# Patient Record
Sex: Female | Born: 1970 | Race: White | Hispanic: No | Marital: Married | Smoking: Never smoker
Health system: Southern US, Community
[De-identification: ages and names within clinical notes are randomized; demographics above are authoritative.]

## PROBLEM LIST (undated history)

## (undated) DIAGNOSIS — N183 Chronic kidney disease, stage 3 unspecified: Secondary | ICD-10-CM

## (undated) DIAGNOSIS — M109 Gout, unspecified: Secondary | ICD-10-CM

## (undated) DIAGNOSIS — Q6102 Congenital multiple renal cysts: Secondary | ICD-10-CM

## (undated) HISTORY — DX: Gout, unspecified: M10.9

## (undated) HISTORY — DX: Congenital multiple renal cysts: Q61.02

## (undated) HISTORY — DX: Chronic kidney disease, stage 3 unspecified: N18.30

## (undated) HISTORY — DX: Chronic kidney disease, stage 3 (moderate): N18.3

---

## 2014-01-23 DIAGNOSIS — F319 Bipolar disorder, unspecified: Secondary | ICD-10-CM | POA: Insufficient documentation

## 2014-01-31 DIAGNOSIS — F3160 Bipolar disorder, current episode mixed, unspecified: Secondary | ICD-10-CM | POA: Insufficient documentation

## 2015-11-06 DIAGNOSIS — E559 Vitamin D deficiency, unspecified: Secondary | ICD-10-CM | POA: Insufficient documentation

## 2015-11-06 DIAGNOSIS — F3170 Bipolar disorder, currently in remission, most recent episode unspecified: Secondary | ICD-10-CM | POA: Insufficient documentation

## 2017-03-10 DIAGNOSIS — F5105 Insomnia due to other mental disorder: Secondary | ICD-10-CM | POA: Insufficient documentation

## 2017-05-22 DIAGNOSIS — H5213 Myopia, bilateral: Secondary | ICD-10-CM | POA: Insufficient documentation

## 2017-05-22 DIAGNOSIS — H43393 Other vitreous opacities, bilateral: Secondary | ICD-10-CM | POA: Insufficient documentation

## 2018-07-27 ENCOUNTER — Ambulatory Visit: Payer: 59 | Admitting: Psychiatry

## 2018-07-27 ENCOUNTER — Encounter: Payer: Self-pay | Admitting: Psychiatry

## 2018-07-27 DIAGNOSIS — F3111 Bipolar disorder, current episode manic without psychotic features, mild: Secondary | ICD-10-CM | POA: Diagnosis not present

## 2018-07-27 NOTE — Progress Notes (Signed)
      [facility] Counselor/Therapist Progress Note   Patient ID: Isabel Benson, MRN: [id number]  Date: 07/27/2018  Timespent: 47 minutes   Treatment Type: Individual   Reported Symptoms: situational anxiety   Mental Status Exam:    Appearance:   Well Groomed     Behavior:  Appropriate  Motor:  Normal  Speech/Language:   Normal Rate  Affect:  Appropriate  Mood:  normal  Thought process:  normal  Thought content:    WNL  Sensory/Perceptual disturbances:    WNL  Orientation:  oriented to person, place and time/date  Attention:  Good  Concentration:  Good  Memory:  WNL  Fund of knowledge:   Good  Insight:    Good  Judgment:   Good  Impulse Control:  Good     Risk Assessment: Danger to Self:  No Self-injurious Behavior: No Danger to Others: No Duty to Warn:no Physical Aggression / Violence:No  Access to Firearms a concern: No  Gang Involvement:No    Subjective: Patient was present for session.  Patient reported that she continues to do very well.  She shared the positive things that are occurring at her job currently and how things are going to progress.  Patient has made it through traveling without having any manic episode which has been an issue in her previous job.  She also explained she is starting to look towards more of future possibilities in her new work situation.  Discussed what was different about this work situation that she felt is helping her maintain her mood when she travels.  Patient was able to recognize that there is more of a work life balance in her new company which is helping her to be more proactive about self-care.  Patient reported she is also managing the stressors with her husband's family more appropriately able to keep better boundaries.  Ways for patient to continue her progress were discussed in session.  Patient will continue coming once every 2-3 months.  She will call if an appointment as needed sooner   Interventions:  Solution-Oriented/Positive Psychology   Diagnosis:   ICD-10-CM   1. Mild bipolar I disorder, most recent episode manic (HCC) F31.11      Plan: 1.  Patient to continue to engage in individual counseling 5-6 times a year or as needed. 2.  Patient to identify and apply CBT, coping skills learned in treatment to decrease manic and anxiety symptoms. 3.  Patient to contact this office, go to the local ED or call 911 if a crisis or emergency develops between visits.   Stevphen Meuse, [place]

## 2018-08-11 ENCOUNTER — Telehealth: Payer: Self-pay | Admitting: Psychiatry

## 2018-08-11 NOTE — Telephone Encounter (Signed)
Received note from nephrology and labs from 02/16/18. Labs obtained at time of visit on 08/06/18 and results are pending.  Lithium level, BMP, and TSH ordered at last visit on 05/11/18. No results received to date.   Will request that nurse call pt to ask if labs were drawn, and if not, where she would like lab order sent.

## 2018-08-12 NOTE — Telephone Encounter (Signed)
TC UTR LM to call back. 

## 2018-08-17 NOTE — Telephone Encounter (Signed)
TCUTRLM

## 2018-08-23 NOTE — Telephone Encounter (Signed)
Spoke to Pt. She has Scripts for [facility] and will have Labs drawn this week, and will notify us.

## 2018-08-25 ENCOUNTER — Other Ambulatory Visit: Payer: Self-pay | Admitting: Psychiatry

## 2018-08-26 LAB — BASIC METABOLIC PANEL WITH GFR
BUN/Creatinine Ratio: 14 (calc) (ref 6–22)
BUN: 22 mg/dL (ref 7–25)
CO2: 27 mmol/L (ref 20–32)
Calcium: 9.6 mg/dL (ref 8.6–10.2)
Chloride: 105 mmol/L (ref 98–110)
Creat: 1.58 mg/dL — ABNORMAL HIGH (ref 0.50–1.10)
GFR, Est African American: 45 mL/min/{1.73_m2} — ABNORMAL LOW (ref >=60)
GFR, Est Non African American: 39 mL/min/{1.73_m2} — ABNORMAL LOW (ref >=60)
Glucose, Bld: 85 mg/dL (ref 65–99)
Potassium: 4.4 mmol/L (ref 3.5–5.3)
SODIUM: 137 mmol/L (ref 135–146)

## 2018-08-26 LAB — LITHIUM LEVEL: Lithium Lvl: 0.9 mmol/L (ref 0.6–1.2)

## 2018-08-26 LAB — TSH: TSH: 2.33 mIU/L

## 2018-10-04 ENCOUNTER — Encounter: Payer: Self-pay | Admitting: Psychiatry

## 2018-10-04 ENCOUNTER — Ambulatory Visit: Payer: 59 | Admitting: Psychiatry

## 2018-10-04 DIAGNOSIS — F3111 Bipolar disorder, current episode manic without psychotic features, mild: Secondary | ICD-10-CM | POA: Diagnosis not present

## 2018-10-04 NOTE — Progress Notes (Signed)
      [facility] Counselor/Therapist Progress Note  Patient ID: Isabel Benson, MRN: [id number],    Date: 10/04/2018  Time Spent: 45 minutes   Treatment Type: Individual Therapy  Reported Symptoms: at times sleep issues, as well as some mood changes  Mental Status Exam:  Appearance:   Well Groomed     Behavior:  Appropriate  Motor:  Normal  Speech/Language:   Normal Rate  Affect:  Appropriate  Mood:  normal  Thought process:  normal  Thought content:    WNL  Sensory/Perceptual disturbances:    WNL  Orientation:  oriented to person, place and time/date  Attention:  Good  Concentration:  Good  Memory:  WNL  Fund of knowledge:   Good  Insight:    Good  Judgment:   Good  Impulse Control:  Good   Risk Assessment: Danger to Self:  No Self-injurious Behavior: No Danger to Others: No Duty to Warn:no Physical Aggression / Violence:No  Access to Firearms a concern: No  Gang Involvement:No   Subjective: Patient was present for session.  Patient reported that she is continuing to do well overall.  She shared that there were some mood anxious over the holidays but nothing extreme or that she is concerned about.  Patient reported she did well over the holidays despite some of the different personalities she had to interact.  Patient explained that her stepfather's Alzheimer's is getting worse and she is trying to help her mother realize she has to make some changes in her life.  Patient reported she is utilizing her coping skills to make sure she does not take on something that is not hers.  Patient was encouraged to continue using her coping skills to help her perspective in the difficult situation.  Patient shared she is considering a new role at work and feels excited where she is, over the last year. Ways for patient to continue her progress were discussed in session and plans developed.  Interventions: Solution-Oriented/Positive Psychology  Diagnosis:   ICD-10-CM   1. Bipolar I  disorder, most recent episode (or current) manic, mild (HCC) F31.11     Plan: 1.  Patient to continue to engage in individual counseling 2-4 times a month or as needed. 2.  Patient to identify and apply CBT, coping skills learned in treatment to mantain mood. 3.  Patient to contact this office, go to the local ED or call 911 if a crisis or emergency develops between visits.  Stevphen Meuse, [place]

## 2018-11-02 ENCOUNTER — Ambulatory Visit (INDEPENDENT_AMBULATORY_CARE_PROVIDER_SITE_OTHER): Payer: 59 | Admitting: Psychiatry

## 2018-11-02 ENCOUNTER — Encounter: Payer: Self-pay | Admitting: Psychiatry

## 2018-11-02 VITALS — BP 101/63 | HR 69

## 2018-11-02 DIAGNOSIS — Z79899 Other long term (current) drug therapy: Secondary | ICD-10-CM

## 2018-11-02 DIAGNOSIS — F99 Mental disorder, not otherwise specified: Secondary | ICD-10-CM | POA: Diagnosis not present

## 2018-11-02 DIAGNOSIS — F5105 Insomnia due to other mental disorder: Secondary | ICD-10-CM | POA: Diagnosis not present

## 2018-11-02 DIAGNOSIS — F3176 Bipolar disorder, in full remission, most recent episode depressed: Secondary | ICD-10-CM | POA: Diagnosis not present

## 2018-11-02 MED ORDER — OMEGA-3-ACID ETHYL ESTERS 1 G PO CAPS: 1.0000 g | ORAL_CAPSULE | Freq: Every day | ORAL | 11 refills | Status: DC

## 2018-11-02 MED ORDER — L-METHYLFOLATE 15 MG PO TABS: 15.0000 mg | ORAL_TABLET | Freq: Every day | ORAL | 11 refills | Status: DC

## 2018-11-02 MED ORDER — ZOLPIDEM TARTRATE 10 MG PO TABS: 5.0000 mg | ORAL_TABLET | Freq: Every evening | ORAL | 0 refills | Status: DC | PRN

## 2018-11-02 MED ORDER — LORAZEPAM 0.5 MG PO TABS: 0.5000 mg | ORAL_TABLET | Freq: Every evening | ORAL | 0 refills | Status: DC | PRN

## 2018-11-02 MED ORDER — LITHIUM CARBONATE ER 300 MG PO TBCR: 600.0000 mg | EXTENDED_RELEASE_TABLET | Freq: Every day | ORAL | 1 refills | Status: DC

## 2018-11-02 NOTE — Progress Notes (Signed)
Isabel NorrisMeredith Benson [id number] 04/12/1971 48 y.o.  Subjective:   Patient ID:  Isabel Benson is a 48 y.o. (DOB 04/12/1971) female.  Chief Complaint:  Chief Complaint  Patient presents with  . Follow-up    h/o Bipolar D/O and anxiety    HPI Isabel Benson presents to the office today for follow-up of h/o mood lability. She reports "things have been going really good." She reports that "I think my mood has been pretty good overall." She reports some irritability and feeling on edge for about 1-2 days around menses. Denies irritability aside from time of menses. Denies any recent depression. Denies any recent manic s/s to include excessive spending or risky behavior. She reports that energy has been adequate and that she noticed she was tired and needed to rest on the weekend. Motivation has been good. She reports that her appetite is stable. Reports some recent intentional weight loss with eliminating sugar, gluten, etc. She reports that her concentration is "really good" and able to sustaining focus. 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 Able to sit and take practice exams for about 2.5-3 hours uninterrupted.   She reports adequate sleep and estimates sleeping 8 hours on average. "My stress is low" and denies any recent anxiety. Reports that she is enjoying her job that she started last May. Reports that she has not needed Ativan prn or Ambien prn recently. She reports that she has been traveling periodically with her work and has not had any disruption in mood s/s with travel.   Denies SI.    Review of Systems:  Review of Systems  Musculoskeletal: Negative for gait problem.  Neurological: Negative for tremors.  Psychiatric/Behavioral:       Please refer to HPI    Medications: I have reviewed the patient's current medications.  Current Outpatient Medications  Medication Sig Dispense Refill  . L-Methylfolate 15 MG TABS Take 1 tablet (15 mg total) by mouth daily  for 30 days. 30 tablet 11  . lithium carbonate (LITHOBID) 300 MG CR tablet Take 2 tablets (600 mg total) by mouth at bedtime. 180 tablet 1  . omega-3 acid ethyl esters (LOVAZA) 1 g capsule Take 1 capsule (1 g total) by mouth daily for 30 days. 30 capsule 11  . LORazepam (ATIVAN) 0.5 MG tablet Take 1 tablet (0.5 mg total) by mouth at bedtime as needed for up to 30 days for anxiety. 30 tablet 0  . zolpidem (AMBIEN) 10 MG tablet Take 0.5 tablets (5 mg total) by mouth at bedtime as needed for up to 30 days for sleep. 30 tablet 0   No current facility-administered medications for this visit.     Medication Side Effects: Other: Occasional  "twitching" of hands and maybe occasionally in her feet.   Allergies: No Known Allergies  Past Medical History:  Diagnosis Date  . Chronic renal failure, stage 3 (moderate) (HCC)   . Multiple renal cysts     Family History  Problem Relation Age of Onset  . Mood Disorder Father   . Anxiety disorder Sister   . Depression Sister   . Depression Maternal Grandmother     Social History   Socioeconomic History  . Marital status: Married    Spouse name: Not on file  . Number of children: Not on file  . Years of education: Not on file  . Highest education level: Not on file  Occupational History  . Not on file  Social Needs  . Financial resource strain: Not  on file  . Food insecurity:    Worry: Not on file    Inability: Not on file  . Transportation needs:    Medical: Not on file    Non-medical: Not on file  Tobacco Use  . Smoking status: Never Smoker  . Smokeless tobacco: Never Used  Substance and Sexual Activity  . Alcohol use: Not Currently    Frequency: Never  . Drug use: Never  . Sexual activity: Not on file  Lifestyle  . Physical activity:    Days per week: Not on file    Minutes per session: Not on file  . Stress: Not on file  Relationships  . Social connections:    Talks on phone: Not on file    Gets together: Not on file     Attends religious service: Not on file    Active member of club or organization: Not on file    Attends meetings of clubs or organizations: Not on file    Relationship status: Not on file  . Intimate partner violence:    Fear of current or ex partner: Not on file    Emotionally abused: Not on file    Physically abused: Not on file    Forced sexual activity: Not on file  Other Topics Concern  . Not on file  Social History Narrative  . Not on file    Past Medical History, Surgical history, Social history, and Family history were reviewed and updated as appropriate.   Please see review of systems for further details on the patient's review from today.   Objective:   Physical Exam:  BP 101/63   Pulse 69   Physical Exam Constitutional:      General: She is not in acute distress.    Appearance: She is well-developed.  Musculoskeletal:        General: No deformity.  Neurological:     Mental Status: She is alert and oriented to person, place, and time.     Coordination: Coordination normal.  Psychiatric:        Attention and Perception: Attention and perception normal. She does not perceive auditory or visual hallucinations.        Mood and Affect: Mood normal. Mood is not anxious or depressed. Affect is not labile, blunt, angry or inappropriate.        Speech: Speech normal.        Behavior: Behavior normal.        Thought Content: Thought content normal. Thought content does not include homicidal or suicidal ideation. Thought content does not include homicidal or suicidal plan.        Cognition and Memory: Cognition and memory normal.        Judgment: Judgment normal.     Comments: Insight intact. No delusions.      Lab Review:     Component Value Date/Time   NA 137 08/25/2018 0833   K 4.4 08/25/2018 0833   CL 105 08/25/2018 0833   CO2 27 08/25/2018 0833   GLUCOSE 85 08/25/2018 0833   BUN 22 08/25/2018 0833   CREATININE 1.58 (H) 08/25/2018 0833   CALCIUM 9.6  08/25/2018 0833   GFRNONAA 39 (L) 08/25/2018 0833   GFRAA 45 (L) 08/25/2018 0833    No results found for: WBC, RBC, HGB, HCT, PLT, MCV, MCH, MCHC, RDW, LYMPHSABS, MONOABS, EOSABS, BASOSABS  Lithium Lvl  Date Value Ref Range Status  08/25/2018 0.9 0.6 - 1.2 mmol/L Final     No results  found for: PHENYTOIN, PHENOBARB, VALPROATE, CBMZ   .res Assessment: Plan:   Discussed continuing current plan of care since mood and anxiety s/s are well controlled and nephrologist notes that renal function remains stable. Discussed continuing to monitor Cr and Lithium level every 6 months to assess for any s/s of worsening renal function/or elevations in Lithium level.  Continue Lithium ER 600 mg po QHS for mood. Continue L-methylfolate and Lovaza for augmentation of mood s/s. Continue Ativan 0.5 mg po qd prn and Ambien 5 mg po QHS prn since these medications have been effective in the past for insomnia and manic s/s. Recommend [facility].   Bipolar disorder, in full remission, most recent episode depressed (HCC) - Plan: lithium carbonate (LITHOBID) 300 MG CR tablet, omega-3 acid ethyl esters (LOVAZA) 1 g capsule, L-Methylfolate 15 MG TABS, LORazepam (ATIVAN) 0.5 MG tablet  High risk medication use - Plan: Lithium level  Insomnia due to other mental disorder - Plan: zolpidem (AMBIEN) 10 MG tablet  Please see After Visit Summary for patient specific instructions.  Future Appointments  Date Time Provider Department Center  11/30/2018 11:00 AM Stevphen MeuseIngram, [place], [facility] None    Orders Placed This Encounter  Procedures  . Lithium level      -------------------------------

## 2018-11-30 ENCOUNTER — Ambulatory Visit (INDEPENDENT_AMBULATORY_CARE_PROVIDER_SITE_OTHER): Payer: 59 | Admitting: Psychiatry

## 2018-11-30 ENCOUNTER — Encounter: Payer: Self-pay | Admitting: Psychiatry

## 2018-11-30 DIAGNOSIS — F3176 Bipolar disorder, in full remission, most recent episode depressed: Secondary | ICD-10-CM | POA: Diagnosis not present

## 2018-11-30 NOTE — Progress Notes (Signed)
      [facility] Counselor/Therapist Progress Note  Patient ID: Isabel Benson, MRN: [id number],    Date: 11/30/2018  Time Spent: 52 minutes  Treatment Type: Individual Therapy  Reported Symptoms: some sadness/ negative thoughts  Mental Status Exam:  Appearance:   Casual     Behavior:  Appropriate  Motor:  Normal  Speech/Language:   Normal Rate  Affect:  Appropriate  Mood:  normal  Thought process:  normal  Thought content:    WNL  Sensory/Perceptual disturbances:    WNL  Orientation:  oriented to person, place, time/date and situation  Attention:  Good  Concentration:  Good  Memory:  WNL  Fund of knowledge:   Good  Insight:    Good  Judgment:   Good  Impulse Control:  Good   Risk Assessment: Danger to Self:  No Self-injurious Behavior: No Danger to Others: No Duty to Warn:no Physical Aggression / Violence:No  Access to Firearms a concern: No  Gang Involvement:No   Subjective: Patient was present for session.  She explained overall she felt like she was doing very well.  She went on to share she has these moments when she starts thinking about the future and has a lot of what its and uncertainty.  She explained that that cycles into very negative thoughts and can surface some depression.  Developed treatment plan with patient to address that issue.  Discussed cognitive distortions and the importance of recognizing them and being able to change the thoughts.  Patient was encouraged to first start writing down the truth/facts about her life and how things have worked out and progressed so well for her over the past few years.  Patient is to work on reading that information regularly to help her maintain perspective.  Patient is also going to work on finding more things to do in the evening that help her feel productive and keep her mind engaged in positives.  Interventions: Cognitive Behavioral Therapy and Solution-Oriented/Positive Psychology  Diagnosis:   ICD-10-CM    1. Bipolar affective disorder, depressed, in full remission (HCC) F31.76     Plan: 1.  Patient to continue to engage in individual counseling 2-4 times a month or as needed. 2.  Patient to identify and apply CBT, coping skills learned in session to decrease depression symptoms. 3.  Patient to contact this office, go to the local ED or call 911 if a crisis or emergency develops between visits.  Stevphen Meuse, [place]  This record has been created using [employer].  Chart creation errors have been sought, but may not always have been located and corrected. Such creation errors do not reflect on the standard of medical care.

## 2019-01-31 ENCOUNTER — Telehealth: Payer: Self-pay | Admitting: Psychiatry

## 2019-01-31 ENCOUNTER — Ambulatory Visit: Payer: 59 | Admitting: Psychiatry

## 2019-01-31 ENCOUNTER — Other Ambulatory Visit: Payer: Self-pay

## 2019-01-31 ENCOUNTER — Encounter: Payer: Self-pay | Admitting: Psychiatry

## 2019-01-31 DIAGNOSIS — F3176 Bipolar disorder, in full remission, most recent episode depressed: Secondary | ICD-10-CM | POA: Diagnosis not present

## 2019-01-31 DIAGNOSIS — Z79899 Other long term (current) drug therapy: Secondary | ICD-10-CM

## 2019-01-31 NOTE — Telephone Encounter (Signed)
Patient called and said that she needs you to resend [facility] orders for [facility]. Do not put future lab order because it will not be received

## 2019-01-31 NOTE — Progress Notes (Signed)
[facility] Counselor/Therapist Progress Note  Patient ID: Isabel Benson, MRN: [id number],    Date: 01/31/2019  Time Spent: 52 minutes start time 11:00 AM end time 11:52 AM Virtual Visit via Telephone Note Connected with patient by a video enabled telemedicine/telehealth application or telephone, with their informed consent, and verified patient privacy and that I am speaking with the correct person using two identifiers. I discussed the limitations, risks, security and privacy concerns of performing psychotherapy and management service by telephone and the availability of in person appointments. I also discussed with the patient that there may be a patient responsible charge related to this service. The patient expressed understanding and agreed to proceed. I discussed the treatment planning with the patient. The patient was provided an opportunity to ask questions and all were answered. The patient agreed with the plan and demonstrated an understanding of the instructions. The patient was advised to call  our office if  symptoms worsen or feel they are in a crisis state and need immediate contact.   Therapist Location: home Patient Location: home    Treatment Type: Individual Therapy  Reported Symptoms: anxiety, frustration  Mental Status Exam:  Appearance:   Casual     Behavior:  Sharing  Motor:  Normal  Speech/Language:   Normal Rate  Affect:  Congruent  Mood:  normal  Thought process:  normal  Thought content:    WNL  Sensory/Perceptual disturbances:    WNL  Orientation:  oriented to person, place, time/date and situation  Attention:  Good  Concentration:  Good  Memory:  WNL  Fund of knowledge:   Good  Insight:    Good  Judgment:   Good  Impulse Control:  Good   Risk Assessment: Danger to Self:  No Self-injurious Behavior: No Danger to Others: No Duty to Warn:no Physical Aggression / Violence:No  Access to Firearms a concern: No  Gang Involvement:No    Subjective: Met with patient via virtual session through [employer].  Patient reported that overall she had been doing fairly well.  She has continued to work and had a class on line that she had to participate in which is kept her very engaged and at home.  Patient went on to explain some of the different experience she is had to encounter at her work over the past several weeks.  She reported she has been able to use coping skills to stay calm and be able to work through the open situations appropriately.  Patient did report that she and her husband had a difficult time yesterday and her anxiety and frustration increased greatly.  Patient discussed what occurred and why she got triggered.  Ways to deal with that differently and coping skills to utilizing including different self talk to use when future situations occur when interacting with his family were discussed with patient and plans were developed.  Patient did share that she needs to get her blood work done to make sure her lithium levels are correct.  She explained she was going to go today but found out that the prescription had expired.  Patient was encouraged to contact office right after session so that they can contact Thayer Headings her provider and get the issue resolved.  Patient was reminded that she does not do well when her lithium levels are off at all so that she could not wait on that issue but needed to contact the office right after the session and she agreed to do so.  We  will continue meeting with patient bimonthly since she continues to do well.  Interventions: Solution-Oriented/Positive Psychology  Diagnosis:   ICD-10-CM   1. Bipolar affective disorder, depressed, in full remission ([place]) F31.76     Plan: 1.  Patient to continue to engage in individual counseling 2-4 times a month or as needed. 2.  Patient to identify and apply CBT, coping skills learned in session to decrease depression and anxiety symptoms. 3.  Patient to  contact this office, go to the local ED or call 911 if a crisis or emergency develops between visits.  Lina Sayre, [facility]   This record has been created using [employer].  Chart creation errors have been sought, but may not always have been located and corrected. Such creation errors do not reflect on the standard of medical care.

## 2019-02-02 NOTE — Telephone Encounter (Signed)
[facility]

## 2019-02-11 LAB — LITHIUM LEVEL: Lithium Lvl: 0.8 mmol/L (ref 0.6–1.2)

## 2019-04-07 ENCOUNTER — Other Ambulatory Visit: Payer: Self-pay

## 2019-04-07 ENCOUNTER — Encounter: Payer: Self-pay | Admitting: Psychiatry

## 2019-04-07 ENCOUNTER — Ambulatory Visit (INDEPENDENT_AMBULATORY_CARE_PROVIDER_SITE_OTHER): Payer: 59 | Admitting: Psychiatry

## 2019-04-07 DIAGNOSIS — F3176 Bipolar disorder, in full remission, most recent episode depressed: Secondary | ICD-10-CM

## 2019-04-07 NOTE — Progress Notes (Signed)
[facility] Counselor/Therapist Progress Note  Patient ID: Isabel Benson, MRN: [id number],    Date: 04/07/2019  Time Spent: 47 minutes  Treatment Type: Individual Therapy  Reported Symptoms: anxiety, periods of sadness  Mental Status Exam:  Appearance:   Well Groomed     Behavior:  Appropriate  Motor:  Normal  Speech/Language:   Normal Rate  Affect:  Appropriate  Mood:  normal  Thought process:  normal  Thought content:    WNL  Sensory/Perceptual disturbances:    WNL  Orientation:  oriented to person, place, time/date and situation  Attention:  Good  Concentration:  Good  Memory:  WNL  Fund of knowledge:   Good  Insight:    Good  Judgment:   Good  Impulse Control:  Good   Risk Assessment: Danger to Self:  No Self-injurious Behavior: No Danger to Others: No Duty to Warn:no Physical Aggression / Violence:No  Access to Firearms a concern: No  Gang Involvement:No   Subjective: Patient was present for session.  She reported that overall she feels things are going very well.  She shared that she feels that the isolation is starting to get to her.  She shared she has periods of sadness and that seems to be related to not being able to interact with people the way she loves to.  Patient stated at this point she is working from home and they are not having church or her small group.  Because of those things she is not having the amount of interaction that she likes or typically has in her life.  Patient was encouraged to think through different ways to keep her serotonin up through exercise also patient was encouraged to find safe ways to try and connect with others and have more of that social interaction that seems to help her stay more balanced.  Different strategies to help her were discussed in session.  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  She shared that with the situation when she took the test it did  not go as well as she had hoped again.  Patient stated she is already having some different options in her head encourage patient to follow through on the options and to remind herself that she is doing great with or without the certification.  Patient was also encouraged to remind herself of all the positives that have occurred in the ways that she is maintaining even though the situation is very difficult.  At this time it was agreed patient would stay on a bimonthly basis for sessions since it seems her symptoms are more situational and she has some plans to manage them appropriately.  Patient will contact clinician's office if something changes and she needs to come in for an earlier appointment.  Interventions: Cognitive Behavioral Therapy and Solution-Oriented/Positive Psychology  Diagnosis:   ICD-10-CM   1. Bipolar disorder, in full remission, most recent episode depressed ([place])  F31.76     Plan: 1.  Patient to continue to engage in individual counseling 1 time every other month or as needed. 2.  Patient to identify and apply CBT, coping skills learned in session to decrease depression and anxiety symptoms. 3.  Patient to contact this office, go to the local ED or call 911 if a crisis or emergency develops between visits.  Lina Sayre, [facility]   This record has been created using [employer].  Chart creation errors have  been sought, but may not always have been located and corrected. Such creation errors do not reflect on the standard of medical care.

## 2019-05-03 ENCOUNTER — Ambulatory Visit (INDEPENDENT_AMBULATORY_CARE_PROVIDER_SITE_OTHER): Payer: 59 | Admitting: Psychiatry

## 2019-05-03 ENCOUNTER — Encounter: Payer: Self-pay | Admitting: Psychiatry

## 2019-05-03 ENCOUNTER — Other Ambulatory Visit: Payer: Self-pay

## 2019-05-03 DIAGNOSIS — F99 Mental disorder, not otherwise specified: Secondary | ICD-10-CM

## 2019-05-03 DIAGNOSIS — F5105 Insomnia due to other mental disorder: Secondary | ICD-10-CM | POA: Diagnosis not present

## 2019-05-03 DIAGNOSIS — F3176 Bipolar disorder, in full remission, most recent episode depressed: Secondary | ICD-10-CM

## 2019-05-03 MED ORDER — LORAZEPAM 1 MG PO TABS: 1.0000 mg | ORAL_TABLET | Freq: Every day | ORAL | 1 refills | Status: DC | PRN

## 2019-05-03 MED ORDER — OMEGA-3-ACID ETHYL ESTERS 1 G PO CAPS: 1.0000 g | ORAL_CAPSULE | Freq: Every day | ORAL | 11 refills | Status: DC

## 2019-05-03 MED ORDER — LITHIUM CARBONATE ER 300 MG PO TBCR: 600.0000 mg | EXTENDED_RELEASE_TABLET | Freq: Every day | ORAL | 1 refills | Status: DC

## 2019-05-03 MED ORDER — ZOLPIDEM TARTRATE 10 MG PO TABS: 5.0000 mg | ORAL_TABLET | Freq: Every evening | ORAL | 2 refills | Status: DC | PRN

## 2019-05-03 MED ORDER — L-METHYLFOLATE 15 MG PO TABS: 15.0000 mg | ORAL_TABLET | Freq: Every day | ORAL | 11 refills | Status: DC

## 2019-05-03 NOTE — Progress Notes (Signed)
Isabel NorrisMeredith Wojnarowski [id number] 1971/02/26 48 y.o.  Subjective:   Patient ID:  Isabel Benson is a 48 y.o. (DOB 1971/02/26) female.  Chief Complaint:  Chief Complaint  Patient presents with  . Follow-up    h/o Bipolar D/O    HPI Isabel NorrisMeredith Hazan presents to the office today for follow-up of Bipolar D/O. She has been working remotely since mid-March. She reports that she has been missing the social interaction of work. Husband works remotely. She reports that she has had adjusted to changes and continues to be productive. She reports that her mood has been stable. She reports that she has had some appropriate, expected sadness in response to some losses associated with the pandemic. She reports that she notices some slight irritability right before menses. She reports that this is the only difference in mood that she has noticed. Denies any significant anxiety. Has had some brief situational stress with certain events, such as being involved with termination of an employee. She reports that her appetite has been good. She reports that she feels that she is eating healthier and eating the same amount and has been gaining weight. She reports that she is walking several miles twice daily. Reports that there is less physical activity with working from home. She reports that her energy and motivation are typically good. Denies impaired concentration. She reports that she has been sleeping well. Reports sleeping at least 8 hours a night. Denies any impulsive or risky behavior. Reports less spending. Denies SI.   Reports that her parents have been very cautious about pandemic and limiting interactions.    Reports that she has not been taking [place] recently.  Review of Systems:  Review of Systems  Gastrointestinal: Negative.   Musculoskeletal: Negative for gait problem.  Neurological: Negative for tremors.  Psychiatric/Behavioral:       Please refer to HPI    Medications: I have reviewed the  patient's current medications.  Current Outpatient Medications  Medication Sig Dispense Refill  . L-Methylfolate 15 MG TABS Take 1 tablet (15 mg total) by mouth daily. 30 tablet 11  . lithium carbonate (LITHOBID) 300 MG CR tablet Take 2 tablets (600 mg total) by mouth at bedtime. 180 tablet 1  . LORazepam (ATIVAN) 1 MG tablet Take 1 tablet (1 mg total) by mouth daily as needed for anxiety. 30 tablet 1  . omega-3 acid ethyl esters (LOVAZA) 1 g capsule Take 1 capsule (1 g total) by mouth daily. 30 capsule 11  . zolpidem (AMBIEN) 10 MG tablet Take 0.5 tablets (5 mg total) by mouth at bedtime as needed for sleep. 30 tablet 2   No current facility-administered medications for this visit.     Medication Side Effects: None  Allergies:  Allergies  Allergen Reactions  . Sudafed [Pseudoephedrine Hcl]     Past Medical History:  Diagnosis Date  . Chronic renal failure, stage 3 (moderate) (HCC)   . Multiple renal cysts     Family History  Problem Relation Age of Onset  . Mood Disorder Father   . Anxiety disorder Sister   . Depression Sister   . Depression Maternal Grandmother     Social History   Socioeconomic History  . Marital status: Married    Spouse name: Not on file  . Number of children: Not on file  . Years of education: Not on file  . Highest education level: Not on file  Occupational History  . Not on file  Social Needs  . Financial resource strain: Not  on file  . Food insecurity    Worry: Not on file    Inability: Not on file  . Transportation needs    Medical: Not on file    Non-medical: Not on file  Tobacco Use  . Smoking status: Never Smoker  . Smokeless tobacco: Never Used  Substance and Sexual Activity  . Alcohol use: Not Currently    Frequency: Never  . Drug use: Never  . Sexual activity: Not on file  Lifestyle  . Physical activity    Days per week: Not on file    Minutes per session: Not on file  . Stress: Not on file  Relationships  . Social  Herbalist on phone: Not on file    Gets together: Not on file    Attends religious service: Not on file    Active member of club or organization: Not on file    Attends meetings of clubs or organizations: Not on file    Relationship status: Not on file  . Intimate partner violence    Fear of current or ex partner: Not on file    Emotionally abused: Not on file    Physically abused: Not on file    Forced sexual activity: Not on file  Other Topics Concern  . Not on file  Social History Narrative  . Not on file    Past Medical History, Surgical history, Social history, and Family history were reviewed and updated as appropriate.   Please see review of systems for further details on the patient's review from today.   Objective:   Physical Exam:  Wt 150 lb (68 kg)   Physical Exam Constitutional:      General: She is not in acute distress.    Appearance: She is well-developed.  Musculoskeletal:        General: No deformity.  Neurological:     Mental Status: She is alert and oriented to person, place, and time.     Coordination: Coordination normal.  Psychiatric:        Attention and Perception: Attention and perception normal. She does not perceive auditory or visual hallucinations.        Mood and Affect: Mood normal. Mood is not anxious or depressed. Affect is not labile, blunt, angry or inappropriate.        Speech: Speech normal.        Behavior: Behavior normal.        Thought Content: Thought content normal. Thought content does not include homicidal or suicidal ideation. Thought content does not include homicidal or suicidal plan.        Cognition and Memory: Cognition and memory normal.        Judgment: Judgment normal.     Comments: Insight intact. No delusions.      Lab Review:     Component Value Date/Time   NA 137 08/25/2018 0833   K 4.4 08/25/2018 0833   CL 105 08/25/2018 0833   CO2 27 08/25/2018 0833   GLUCOSE 85 08/25/2018 0833   BUN 22  08/25/2018 0833   CREATININE 1.58 (H) 08/25/2018 0833   CALCIUM 9.6 08/25/2018 0833   GFRNONAA 39 (L) 08/25/2018 0833   GFRAA 45 (L) 08/25/2018 0833    No results found for: WBC, RBC, HGB, HCT, PLT, MCV, MCH, MCHC, RDW, LYMPHSABS, MONOABS, EOSABS, BASOSABS  Lithium Lvl  Date Value Ref Range Status  02/10/2019 0.8 0.6 - 1.2 mmol/L Final     No results found  for: PHENYTOIN, PHENOBARB, VALPROATE, CBMZ   .res Assessment: Plan:    Will continue current plan of care since mood signs and symptoms remain well controlled without significant tolerability issues.  Labs and documentation from nephrologist reviewed. Patient to follow-up in 6 months or sooner if clinically indicated.  Will order labs at that time. Recommend continuing psychotherapy with Stevphen MeuseHolly Ingram, LPC. Patient advised to contact office with any questions, adverse effects, or acute worsening in signs and symptoms.   Sharyl NimrodMeredith was seen today for follow-up.  Diagnoses and all orders for this visit:  Insomnia due to other mental disorder -     LORazepam (ATIVAN) 1 MG tablet; Take 1 tablet (1 mg total) by mouth daily as needed for anxiety. -     zolpidem (AMBIEN) 10 MG tablet; Take 0.5 tablets (5 mg total) by mouth at bedtime as needed for sleep.  Bipolar disorder, in full remission, most recent episode depressed (HCC) -     lithium carbonate (LITHOBID) 300 MG CR tablet; Take 2 tablets (600 mg total) by mouth at bedtime. -     omega-3 acid ethyl esters (LOVAZA) 1 g capsule; Take 1 capsule (1 g total) by mouth daily. -     L-Methylfolate 15 MG TABS; Take 1 tablet (15 mg total) by mouth daily.     Please see After Visit Summary for patient specific instructions.  Future Appointments  Date Time Provider Department Center  06/08/2019 11:00 AM [facility] None  11/03/2019 11:00 AM Corie Chiquitoarter, Alesa Echevarria, [facility] CP[facility] None    No orders of the defined types were placed in this  encounter.   -------------------------------

## 2019-06-08 ENCOUNTER — Encounter: Payer: Self-pay | Admitting: Psychiatry

## 2019-06-08 ENCOUNTER — Other Ambulatory Visit: Payer: Self-pay

## 2019-06-08 ENCOUNTER — Ambulatory Visit (INDEPENDENT_AMBULATORY_CARE_PROVIDER_SITE_OTHER): Payer: 59 | Admitting: Psychiatry

## 2019-06-08 DIAGNOSIS — F3176 Bipolar disorder, in full remission, most recent episode depressed: Secondary | ICD-10-CM

## 2019-06-08 NOTE — Progress Notes (Signed)
      [facility] Counselor/Therapist Progress Note  Patient ID: Isabel Benson, MRN: [id number],    Date: 06/08/2019  Time Spent: 49 minutes start time 11:11 a.m. end time 12 PM  Treatment Type: Individual Therapy  Reported Symptoms: anxiety  Mental Status Exam:  Appearance:   Well Groomed     Behavior:  Appropriate  Motor:  Normal  Speech/Language:   Normal Rate  Affect:  Appropriate  Mood:  normal  Thought process:  normal  Thought content:    WNL  Sensory/Perceptual disturbances:    WNL  Orientation:  oriented to person, place, time/date and situation  Attention:  Good  Concentration:  Good  Memory:  WNL  Fund of knowledge:   Good  Insight:    Good  Judgment:   Good  Impulse Control:  Good   Risk Assessment: Danger to Self:  No Self-injurious Behavior: No Danger to Others: No Duty to Warn:no Physical Aggression / Violence:No  Access to Firearms a concern: No  Gang Involvement:No   Subjective: Patient was present for session.  Patient reported she is continuing to feel good and is functioning more of the way that she wants to function.  Patient explained that the work situation even though it is had at stressors has been very positive for her overall.  She explained that she and her husband are making it through the the quarantine.  Patient went on to share that she has been able to maintain healthy perspectives with her in-laws which is helping her maintain her anxiety.  She did not pass her test again but has figured out another certification to get that would help her at work and is decided to pursue that instead.  Patient is working on her diet and exercise to continue to maintain her mood as well as working on positive affirmations.  Patient was able to report feeling that things are moving in a very positive direction and she is thankful that she is gotten to the other side of her episode and is functioning again.  Patient was encouraged to recognize the importance  of continuing to utilize the tools that are helping her and to continue her positive self-care to continue progress.  Interventions: Solution-Oriented/Positive Psychology  Diagnosis:   ICD-10-CM   1. Bipolar affective disorder, depressed, in full remission ([place])  F31.76     Plan: Patient is to continue working on her diet and exercise as well is her positive affirmations to continue keeping her depression under control and being able to function the way that she wants to in her life.  Patient will continue attending treatment on every other month schedule due to her progress. Long-term goal: Develop healthy cognitive patterns and beliefs about self in the world that lead to alleviation and help prevent the relapse of depression symptoms Short-term goal: Identify and replace depressive thinking that leads to depressive feelings and actions.  Lina Sayre, [facility]

## 2019-08-08 ENCOUNTER — Ambulatory Visit: Payer: 59 | Admitting: Psychiatry

## 2019-08-14 ENCOUNTER — Other Ambulatory Visit: Payer: Self-pay | Admitting: Psychiatry

## 2019-08-14 DIAGNOSIS — F3176 Bipolar disorder, in full remission, most recent episode depressed: Secondary | ICD-10-CM

## 2019-09-13 ENCOUNTER — Ambulatory Visit: Payer: 59 | Admitting: Psychiatry

## 2019-09-26 ENCOUNTER — Other Ambulatory Visit: Payer: Self-pay

## 2019-09-26 ENCOUNTER — Ambulatory Visit (INDEPENDENT_AMBULATORY_CARE_PROVIDER_SITE_OTHER): Payer: 59 | Admitting: Psychiatry

## 2019-09-26 DIAGNOSIS — F3176 Bipolar disorder, in full remission, most recent episode depressed: Secondary | ICD-10-CM

## 2019-09-26 NOTE — Progress Notes (Signed)
[facility] Counselor/Therapist Progress Note  Patient ID: Isabel Benson, MRN: [id number],    Date: 09/26/2019  Time Spent: 49 minutes start time 11:11 AM end time 12 PM  Treatment Type: Individual Therapy  Reported Symptoms: anxiety  Mental Status Exam:  Appearance:   Well Groomed     Behavior:  Appropriate  Motor:  Normal  Speech/Language:   Normal Rate  Affect:  Appropriate  Mood:  normal  Thought process:  normal  Thought content:    WNL  Sensory/Perceptual disturbances:    WNL  Orientation:  oriented to person, place, time/date and situation  Attention:  Good  Concentration:  Good  Memory:  WNL  Fund of knowledge:   Good  Insight:    Good  Judgment:   Good  Impulse Control:  Good   Risk Assessment: Danger to Self:  No Self-injurious Behavior: No Danger to Others: No Duty to Warn:no Physical Aggression / Violence:No  Access to Firearms a concern: No  Gang Involvement:No   Subjective: Patient was present for session.  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  She explained she will be managing a few people and be ready to deal with the restructure that is coming soon. She also reported that she was able to pass her HR tests. Patient reported that she has done well to working from home she is realizing that it will be an adjustment if they go back to the office but she is feeling it will be okay. Her mother is  In a study for care givers at [facility] and that has been helpful for her. Patient reported some issues with not being able to be with her mother at this time.  Patient stated that her biggest stressor is still her husband's family.  They are continuing to make choices concerning not wearing mask and going out which are very concerning for patient.  She explained that she tries not to see much since her husband gets very stressed about it as well but they have to set pretty firm limits with not having contact  to deal with the consequences with his mother.  Patient was encouraged to feel positive about her decisions knowing that she has to do what is right for her mother and her stepfather regardless of how her in-laws see things.  Also she is making a positive decision for them as well since they are both in high risk categories even though they are not acting that way.  Patient was encouraged to stay focused on the things she can control fix and change and to support her husband but not feel she needs to fix the situation.  Patient was encouraged to recognize her progress and to feel good about the direction that things are going for her.  Patient was encouraged to take her medication as directed since that seems to help her tremendously be able to function in the manner she would like.   Interventions: Cognitive Behavioral Therapy and Solution-Oriented/Positive Psychology  Diagnosis:   ICD-10-CM   1. Bipolar affective disorder, depressed, in full remission ([place])  F31.76     Plan: Patient is to utilize CBT and coping skills to help maintain an appropriate mood and not have any depression.  Patient is to continue working on her diet and exercise to be as healthy and take care of her brain in the best possible manner. Long-term goal: Develop healthy cognitive patterns and  beliefs about self in the world that lead to alleviation and help prevent the relapse of depression symptoms Short-term goal: Identify and replace depressive thinking that leads to depressive feelings and actions  Stevphen Meuse, [facility]

## 2019-11-03 ENCOUNTER — Ambulatory Visit: Payer: 59 | Admitting: Psychiatry

## 2019-11-08 ENCOUNTER — Ambulatory Visit (INDEPENDENT_AMBULATORY_CARE_PROVIDER_SITE_OTHER): Payer: 59 | Admitting: Psychiatry

## 2019-11-08 ENCOUNTER — Other Ambulatory Visit: Payer: Self-pay

## 2019-11-08 ENCOUNTER — Encounter: Payer: Self-pay | Admitting: Psychiatry

## 2019-11-08 VITALS — Wt 135.0 lb

## 2019-11-08 DIAGNOSIS — F99 Mental disorder, not otherwise specified: Secondary | ICD-10-CM | POA: Diagnosis not present

## 2019-11-08 DIAGNOSIS — F5105 Insomnia due to other mental disorder: Secondary | ICD-10-CM | POA: Diagnosis not present

## 2019-11-08 DIAGNOSIS — F3176 Bipolar disorder, in full remission, most recent episode depressed: Secondary | ICD-10-CM | POA: Diagnosis not present

## 2019-11-08 DIAGNOSIS — Z79899 Other long term (current) drug therapy: Secondary | ICD-10-CM

## 2019-11-08 MED ORDER — L-METHYLFOLATE 15 MG PO TABS: 15.0000 mg | ORAL_TABLET | Freq: Every day | ORAL | 11 refills | Status: DC

## 2019-11-08 MED ORDER — OMEGA-3-ACID ETHYL ESTERS 1 G PO CAPS: 1.0000 g | ORAL_CAPSULE | Freq: Every day | ORAL | 11 refills | Status: DC

## 2019-11-08 MED ORDER — LITHIUM CARBONATE ER 300 MG PO TBCR: 600.0000 mg | EXTENDED_RELEASE_TABLET | Freq: Every day | ORAL | 1 refills | Status: DC

## 2019-11-08 NOTE — Progress Notes (Signed)
Isabel Benson [id number] 1970/11/16 49 y.o.  Subjective:   Patient ID:  Isabel Benson is a 49 y.o. (DOB 12/07/1970) female.  Chief Complaint:  Chief Complaint  Patient presents with  . Follow-up    h/o Bipolar D/O    HPI Isabel Benson presents to the office today for follow-up of Bipolar D/O. She reports that she has been exercising regularly. She reports that she has been walking regularly and feels that this is beneficial for her mental and physical health. Reports that this is also a source of socialization since she walks with either husband or neighbor. Denies any anxiety or worry. Denies depressed mood. Denies any manic s/s. Energy has been adequate but not excessive and reports that she fees tired at the end of the day. Sleeping well and averaging about 8 hours a night. Concentration has been adequate. Reports that she tries to structure her time and set a schedule with how much time to set aside for certain tasks. Denies SI.   Has been doing a Fast Metabolism diet where she eats certain foods 2 days in a row. She reports starting this in August and initially lost 8-10 lbs and then another 5 lbs. and is now at her previous baseline.   She reports that in December her work became busier and she has been promoted to a management position. She reports that she is excited about her new role. Continues to work remotely and is not traveling for work. Has a home office at the back of her house and leaves her office at the end of the day.   Father has dementia and reports that parents have been having difficulty with the pandemic. She reports some concern about her parents.   Has not needed Ambien or Lorazepam prn recently.     Review of Systems:  Review of Systems  Genitourinary:       Changes in menses  Musculoskeletal: Negative for gait problem.  Neurological: Negative for tremors.       Reports HA's before menses  Psychiatric/Behavioral:       Please refer to HPI     Medications: I have reviewed the patient's current medications.  Current Outpatient Medications  Medication Sig Dispense Refill  . lithium carbonate (LITHOBID) 300 MG CR tablet Take 2 tablets (600 mg total) by mouth at bedtime. 180 tablet 1  . valACYclovir (VALTREX) 500 MG tablet 1 tab po twice a day at onset of symptoms    . L-Methylfolate 15 MG TABS Take 1 tablet (15 mg total) by mouth daily. 30 tablet 11  . LORazepam (ATIVAN) 1 MG tablet Take 1 tablet (1 mg total) by mouth daily as needed for anxiety. (Patient not taking: Reported on 11/08/2019) 30 tablet 1  . omega-3 acid ethyl esters (LOVAZA) 1 g capsule Take 1 capsule (1 g total) by mouth daily. 30 capsule 11  . zolpidem (AMBIEN) 10 MG tablet Take 0.5 tablets (5 mg total) by mouth at bedtime as needed for sleep. 30 tablet 2   No current facility-administered medications for this visit.    Medication Side Effects: None  Allergies:  Allergies  Allergen Reactions  . Sudafed [Pseudoephedrine Hcl]     Past Medical History:  Diagnosis Date  . Chronic renal failure, stage 3 (moderate)   . Multiple renal cysts     Family History  Problem Relation Age of Onset  . Mood Disorder Father   . Anxiety disorder Sister   . Depression Sister   . Depression Maternal  Grandmother     Social History   Socioeconomic History  . Marital status: Married    Spouse name: Not on file  . Number of children: Not on file  . Years of education: Not on file  . Highest education level: Not on file  Occupational History  . Not on file  Tobacco Use  . Smoking status: Never Smoker  . Smokeless tobacco: Never Used  Substance and Sexual Activity  . Alcohol use: Not Currently  . Drug use: Never  . Sexual activity: Not on file  Other Topics Concern  . Not on file  Social History Narrative  . Not on file   Social Determinants of Health   Financial Resource Strain:   . Difficulty of Paying Living Expenses: Not on file  Food Insecurity:    . Worried About Charity fundraiser in the Last Year: Not on file  . Ran Out of Food in the Last Year: Not on file  Transportation Needs:   . Lack of Transportation (Medical): Not on file  . Lack of Transportation (Non-Medical): Not on file  Physical Activity:   . Days of Exercise per Week: Not on file  . Minutes of Exercise per Session: Not on file  Stress:   . Feeling of Stress : Not on file  Social Connections:   . Frequency of Communication with Friends and Family: Not on file  . Frequency of Social Gatherings with Friends and Family: Not on file  . Attends Religious Services: Not on file  . Active Member of Clubs or Organizations: Not on file  . Attends Archivist Meetings: Not on file  . Marital Status: Not on file  Intimate Partner Violence:   . Fear of Current or Ex-Partner: Not on file  . Emotionally Abused: Not on file  . Physically Abused: Not on file  . Sexually Abused: Not on file    Past Medical History, Surgical history, Social history, and Family history were reviewed and updated as appropriate.   Please see review of systems for further details on the patient's review from today.   Objective:   Physical Exam:  Wt 135 lb (61.2 kg)   Physical Exam Constitutional:      General: She is not in acute distress.    Appearance: She is well-developed.  Musculoskeletal:        General: No deformity.  Neurological:     Mental Status: She is alert and oriented to person, place, and time.     Coordination: Coordination normal.  Psychiatric:        Attention and Perception: Attention and perception normal. She does not perceive auditory or visual hallucinations.        Mood and Affect: Mood normal. Mood is not anxious or depressed. Affect is not labile, blunt, angry or inappropriate.        Speech: Speech normal.        Behavior: Behavior normal.        Thought Content: Thought content normal. Thought content is not paranoid or delusional. Thought content  does not include homicidal or suicidal ideation. Thought content does not include homicidal or suicidal plan.        Cognition and Memory: Cognition and memory normal.        Judgment: Judgment normal.     Comments: Insight intact     Lab Review:     Component Value Date/Time   NA 137 08/25/2018 0833   K 4.4 08/25/2018 5993  CL 105 08/25/2018 0833   CO2 27 08/25/2018 0833   GLUCOSE 85 08/25/2018 0833   BUN 22 08/25/2018 0833   CREATININE 1.58 (H) 08/25/2018 0833   CALCIUM 9.6 08/25/2018 0833   GFRNONAA 39 (L) 08/25/2018 0833   GFRAA 45 (L) 08/25/2018 0833    No results found for: WBC, RBC, HGB, HCT, PLT, MCV, MCH, MCHC, RDW, LYMPHSABS, MONOABS, EOSABS, BASOSABS  Lithium Lvl  Date Value Ref Range Status  02/10/2019 0.8 0.6 - 1.2 mmol/L Final     No results found for: PHENYTOIN, PHENOBARB, VALPROATE, CBMZ   .res Assessment: Plan:   Pt seen for 30 minutes and time spent counseling pt re: her questions about long-term use of Lithium and concerns about making her tx wishes known if she develops dementia like her father and other relatives since she is concerned about worsening s/s if Lithium was d/c'd. Encouraged pt to make her wishes known to her health care POA and that she can also designate a secondary heathcare POA in the event that her husband is not able to make her wishes known. Discussed that she could also write a letter expressing her wishes or including it in her healthcare POA documents.  Will order lithium level. Labs from nephrologist reviewed.  Will continue Lithium CR 600 mg po QHS for mood stabilization. Continue l-methyl folate and Lovaza for augmentation of mood.  Will not refill lorazepam or Ambien at this time since pt reports that she has not needed these medications recently. Continue therapy with Stevphen Meuse, LPC. Pt to f/u in 6 months or sooner if clinically indicated.  Patient advised to contact office with any questions, adverse effects, or acute  worsening in signs and symptoms.  Isabel Benson was seen today for follow-up.  Diagnoses and all orders for this visit:  Bipolar disorder, in full remission, most recent episode depressed (HCC) -     lithium carbonate (LITHOBID) 300 MG CR tablet; Take 2 tablets (600 mg total) by mouth at bedtime. -     L-Methylfolate 15 MG TABS; Take 1 tablet (15 mg total) by mouth daily. -     omega-3 acid ethyl esters (LOVAZA) 1 g capsule; Take 1 capsule (1 g total) by mouth daily.  High risk medication use -     Lithium level -     TSH  Insomnia due to other mental disorder     Please see After Visit Summary for patient specific instructions.  Future Appointments  Date Time Provider Department Center  11/24/2019 11:00 AM Stevphen Meuse, [facility] CP-CP None  05/08/2020  8:30 AM Corie Chiquito, PMHNP CP-CP None    Orders Placed This Encounter  Procedures  . Lithium level  . TSH    -------------------------------

## 2019-11-24 ENCOUNTER — Other Ambulatory Visit: Payer: Self-pay

## 2019-11-24 ENCOUNTER — Ambulatory Visit (INDEPENDENT_AMBULATORY_CARE_PROVIDER_SITE_OTHER): Payer: 59 | Admitting: Psychiatry

## 2019-11-24 DIAGNOSIS — F3176 Bipolar disorder, in full remission, most recent episode depressed: Secondary | ICD-10-CM

## 2019-11-24 NOTE — Progress Notes (Signed)
      [facility] Counselor/Therapist Progress Note  Patient ID: Isabel Benson, MRN: [id number],    Date: 11/27/2019  Time Spent: 50 minutes start time 11:09 AM end time 11:59 AM  Treatment Type: Individual Therapy  Reported Symptoms: anxiety   Mental Status Exam:  Appearance:   Well Groomed     Behavior:  Sharing  Motor:  Normal  Speech/Language:   Normal Rate  Affect:  Appropriate  Mood:  normal  Thought process:  normal  Thought content:    WNL  Sensory/Perceptual disturbances:    WNL  Orientation:  oriented to person, place, time/date and situation  Attention:  Good  Concentration:  Good  Memory:  WNL  Fund of knowledge:   Good  Insight:    Good  Judgment:   Good  Impulse Control:  Good   Risk Assessment: Danger to Self:  No Self-injurious Behavior: No Danger to Others: No Duty to Warn:no Physical Aggression / Violence:No  Access to Firearms a concern: No  Gang Involvement:No   Subjective: Patient was present for session.  She shared she is back in the office 3 days a week and it is going okay.  She has hired a new person and it is going well.   She has started her new job and she has had a good review.  She reported she is very happy with her situation and it is hard to imagine that she originally did not want to and the job that she had been fired from.  Patient was encouraged to recognize all the groups and to see what she is learning about her new situation.  Patient acknowledges that she does better interacting in a healthy environment and that she does not need to be in a stressful situation.  Patient shared that her current work situation is so much better it has impacted her mood and a great manner.  Patient stated she is also working on diet and exercise.  She shared she is concerned about her stepfather who seems to be having a decline of his cognitive functioning and the impact it is going to have on her mother.  Patient was encouraged to continue to be a  source of support and to try and give suggestions and remind herself that she has to allow her mother to work through what she has to work through rather than feeling the need to step in and fix things.  Patient reported that she felt that was very positive and would continue to try to do those suggestions.  Agreed that at this time she would be on a every other month basis since she is doing well overall.  Interventions: Solution-Oriented/Positive Psychology  Diagnosis:   ICD-10-CM   1. Bipolar affective disorder, depressed, in full remission (HCC)  F31.76     Plan: Patient is to utilize coping skills to manage mood appropriately.  Patient is to continue working on diet and exercise to help maintain appropriate mood Long-term goal: Develop the ability to recognize accept and cope with feelings of depression Short-term goal: Increase the frequency of assertive behaviors to express needs desires and expectations  Stevphen Meuse, [facility]

## 2019-12-13 LAB — LITHIUM LEVEL: Lithium Lvl: 0.9 mmol/L (ref 0.6–1.2)

## 2019-12-13 LAB — TSH: TSH: 1.8 mIU/L

## 2019-12-27 ENCOUNTER — Telehealth: Payer: Self-pay

## 2019-12-27 NOTE — Telephone Encounter (Signed)
Prior authorization submitted for OMEGA-3 1G CAPSULE, DENIED on 12/26/2019 due to medication is covered for only hypertriglyceridemia through [employer] [id number]  Submitted through cover my meds

## 2020-02-09 ENCOUNTER — Other Ambulatory Visit: Payer: Self-pay

## 2020-02-09 DIAGNOSIS — F3176 Bipolar disorder, in full remission, most recent episode depressed: Secondary | ICD-10-CM

## 2020-02-09 MED ORDER — LITHIUM CARBONATE ER 300 MG PO TBCR: 600.0000 mg | EXTENDED_RELEASE_TABLET | Freq: Every day | ORAL | 1 refills | Status: DC

## 2020-02-14 ENCOUNTER — Ambulatory Visit: Payer: 59 | Admitting: Psychiatry

## 2020-02-24 ENCOUNTER — Other Ambulatory Visit: Payer: Self-pay

## 2020-02-24 ENCOUNTER — Ambulatory Visit (INDEPENDENT_AMBULATORY_CARE_PROVIDER_SITE_OTHER): Payer: 59 | Admitting: Psychiatry

## 2020-02-24 DIAGNOSIS — F3176 Bipolar disorder, in full remission, most recent episode depressed: Secondary | ICD-10-CM | POA: Diagnosis not present

## 2020-02-24 NOTE — Progress Notes (Signed)
°      [facility] Counselor/Therapist Progress Note  Patient ID: Isabel Benson, MRN: [id number],    Date: 02/24/2020  Time Spent: 50 minutes start time 8:09 AM end time 8:59 AM  Treatment Type: Individual Therapy  Reported Symptoms: anxiety  Mental Status Exam:  Appearance:   Well Groomed     Behavior:  Appropriate  Motor:  Normal  Speech/Language:   Normal Rate  Affect:  Appropriate  Mood:  normal  Thought process:  normal  Thought content:    WNL  Sensory/Perceptual disturbances:    WNL  Orientation:  oriented to person, place, time/date and situation  Attention:  Good  Concentration:  Good  Memory:  WNL  Fund of knowledge:   Good  Insight:    Good  Judgment:   Good  Impulse Control:  Good   Risk Assessment: Danger to Self:  No Self-injurious Behavior: No Danger to Others: No Duty to Warn:no Physical Aggression / Violence:No  Access to Firearms a concern: No  Gang Involvement:No   Subjective: Patient was present for session.  She reported that she is doing well.  She reported she is sleeping, eating, well exercising and feeling great. She shared work is going good and she is enjoying her new position.  She is still getting ready to take the test to get another certification but she is come to the point where if it does not work out she is moving on and feeling positive about things.  Patient stated there continues to be drama within her husband's family but she is managing it more appropriately and being able to set boundaries.  Patient has some concern for her stepfather because he continues to have a decline in his cognitive capacity.  Different strategies to help her manage that and help her mother were discussed and plans were developed in session.  Interventions: Solution-Oriented/Positive Psychology  Diagnosis:   ICD-10-CM   1. Bipolar affective disorder, depressed, in full remission (HCC)  F31.76     Plan: Patient is to use CBT and coping skills to decrease  triggered responses.  Is to continue to work on her self-care through diet and exercise.   Long-term goal: Develop the ability to recognize accept and cope with feelings of depression Short-term goal: Increase the frequency of assertive behaviors to express needs desires and expectations  Stevphen Meuse, [facility]

## 2020-04-26 ENCOUNTER — Other Ambulatory Visit: Payer: Self-pay

## 2020-04-26 ENCOUNTER — Ambulatory Visit (INDEPENDENT_AMBULATORY_CARE_PROVIDER_SITE_OTHER): Payer: 59 | Admitting: Psychiatry

## 2020-04-26 DIAGNOSIS — F3176 Bipolar disorder, in full remission, most recent episode depressed: Secondary | ICD-10-CM

## 2020-04-26 NOTE — Progress Notes (Signed)
[facility] Counselor/Therapist Progress Note  Patient ID: Isabel Benson, MRN: [id number],    Date: 04/26/2020  Time Spent: 50 minutes start time 8:06 AM end time 8:56 AM  Treatment Type: Individual Therapy  Reported Symptoms: anxiety  Mental Status Exam:  Appearance:   Well Groomed     Behavior:  Appropriate  Motor:  Normal  Speech/Language:   Normal Rate  Affect:  Appropriate  Mood:  normal  Thought process:  normal  Thought content:    WNL  Sensory/Perceptual disturbances:    WNL  Orientation:  oriented to person, place, time/date and situation  Attention:  Good  Concentration:  Good  Memory:  WNL  Fund of knowledge:   Good  Insight:    Good  Judgment:   Good  Impulse Control:  Good   Risk Assessment: Danger to Self:  No Self-injurious Behavior: No Danger to Others: No Duty to Warn:no Physical Aggression / Violence:No  Access to Firearms a concern: No  Gang Involvement:No   Subjective: Patient was present for session.  She shared that work is very busy and stressful.  Her boss hired someone to report to her without talking to her about it.  Patient reported it has upset her but she is doing better now.  She went on to share that the person that her boss hired does not have the experience in HR and has already made some major blunders that she is having to address.  Patient explained that it is creating anxiety for her but she is managing it and is using her self talk to remind herself that all she has to do is the best she can to trying them and her boss will have to do with anything that does not go well.  Patient also shared that she is having stress with her husband's family but she is working to set good boundaries and to focus on what she can control fix and change and to let the other things go.  She is doing that with her mother as well she is giving her the information on how to help her stepfather and if she chooses not to use it she recognizes she cannot do  anything about that.  Patient stated she is working hard on her self-care through diet and exercise and feels positive about that is going.  Patient was encouraged to continue the progress she is making and to focus on what she can control fix and change in her self-care especially as she deals with the fact that one of her dogs is dying and that is a very hard time for her and her husband since they are like children to them.  Looked at patient's treatment goals and agreed that it seems they are being met so new treatment plan will be developed at next session.  Patient agreed that she can continue going on every other month basis for appointments at this time.  Interventions: Cognitive Behavioral Therapy and Solution-Oriented/Positive Psychology  Diagnosis:   ICD-10-CM   1. Bipolar affective disorder, depressed, in full remission ([place])  F31.76     Plan: Patient is to continue using CBT and coping skills to keep herself grounded and her mood balanced.  Patient is to work on diet and exercise to continue to take care of mood.  Patient is to take medication as directed.  Treatment planning goals will be reassessed at next session. Long-term goal: Develop the ability to recognize accept and cope with feelings of  depression Short-term goal: Increase the frequency of assertive behaviors to express needs desires and expectations  Lina Sayre, [facility]

## 2020-05-08 ENCOUNTER — Ambulatory Visit: Payer: 59 | Admitting: Psychiatry

## 2020-05-21 ENCOUNTER — Encounter: Payer: Self-pay | Admitting: Psychiatry

## 2020-05-21 ENCOUNTER — Ambulatory Visit (INDEPENDENT_AMBULATORY_CARE_PROVIDER_SITE_OTHER): Payer: 59 | Admitting: Psychiatry

## 2020-05-21 ENCOUNTER — Other Ambulatory Visit: Payer: Self-pay

## 2020-05-21 DIAGNOSIS — Z79899 Other long term (current) drug therapy: Secondary | ICD-10-CM | POA: Diagnosis not present

## 2020-05-21 DIAGNOSIS — F3176 Bipolar disorder, in full remission, most recent episode depressed: Secondary | ICD-10-CM | POA: Diagnosis not present

## 2020-05-21 DIAGNOSIS — F5105 Insomnia due to other mental disorder: Secondary | ICD-10-CM | POA: Diagnosis not present

## 2020-05-21 DIAGNOSIS — F99 Mental disorder, not otherwise specified: Secondary | ICD-10-CM | POA: Diagnosis not present

## 2020-05-21 MED ORDER — LORAZEPAM 1 MG PO TABS: 1.0000 mg | ORAL_TABLET | Freq: Every day | ORAL | 0 refills | Status: DC | PRN

## 2020-05-21 MED ORDER — ZOLPIDEM TARTRATE 10 MG PO TABS: 5.0000 mg | ORAL_TABLET | Freq: Every evening | ORAL | 0 refills | Status: DC | PRN

## 2020-05-21 MED ORDER — LITHIUM CARBONATE ER 300 MG PO TBCR: 600.0000 mg | EXTENDED_RELEASE_TABLET | Freq: Every day | ORAL | 1 refills | Status: DC

## 2020-05-21 MED ORDER — L-METHYLFOLATE 15 MG PO TABS: 15.0000 mg | ORAL_TABLET | Freq: Every day | ORAL | 11 refills | Status: DC

## 2020-05-21 NOTE — Progress Notes (Signed)
Isabel Benson [id number] [birth year] 49 y.o.  Subjective:   Patient ID:  Isabel Benson is a 49 y.o. (DOB 1971/03/21) female.  Chief Complaint:  Chief Complaint  Patient presents with  . Follow-up    h/o Bipolar d/o    HPI [name] presents to the office today for follow-up of mood disturbance. "I feel the best I have felt in a long time." She reports that her mood has been "very stable." Denies depressed, irritable, or manic moods. She reports that she has been sleeping very well. She reports that her sleep score is usually in the upper 70's and sometimes low 80's. Denies anxiety. Appetite has been good. Energy and motivation have been good. Concentration is adequate. Denies SI.   Has been running. She started running intervals with her neighbor. Training for half-marathon.   Just returned from vacation in [place] to [facility]. Her work has been busy and has 3 new people working for her. Enjoying her new position after being promoted in January. Has been in the office mostly since February.  Father has dementia.  Her mother has difficulty managing father's illness.     Review of Systems:  Review of Systems  Musculoskeletal: Negative for gait problem.  Neurological: Negative for tremors.  Psychiatric/Behavioral:       Please refer to HPI    Medications: I have reviewed the patient's current medications.  Current Outpatient Medications  Medication Sig Dispense Refill  . lithium carbonate (LITHOBID) 300 MG CR tablet Take 2 tablets (600 mg total) by mouth at bedtime. 180 tablet 1  . L-Methylfolate 15 MG TABS Take 1 tablet (15 mg total) by mouth daily. 30 tablet 11  . LORazepam (ATIVAN) 1 MG tablet Take 1 tablet (1 mg total) by mouth daily as needed for anxiety. 30 tablet 0  . omega-3 acid ethyl esters (LOVAZA) 1 g capsule Take 1 capsule (1 g total) by mouth daily. 30 capsule 11  . valACYclovir (VALTREX) 500 MG tablet 1 tab po twice a day at onset of symptoms     . zolpidem (AMBIEN) 10 MG tablet Take 0.5 tablets (5 mg total) by mouth at bedtime as needed for sleep. 30 tablet 0   No current facility-administered medications for this visit.    Medication Side Effects: None Rare tremor  Allergies:  Allergies  Allergen Reactions  . Sudafed [Pseudoephedrine Hcl]     Past Medical History:  Diagnosis Date  . Chronic renal failure, stage 3 (moderate)   . Multiple renal cysts     Family History  Problem Relation Age of Onset  . Mood Disorder Father   . Anxiety disorder Sister   . Depression Sister   . Depression Maternal Grandmother     Social History   Socioeconomic History  . Marital status: Married    Spouse name: Not on file  . Number of children: Not on file  . Years of education: Not on file  . Highest education level: Not on file  Occupational History  . Not on file  Tobacco Use  . Smoking status: Never Smoker  . Smokeless tobacco: Never Used  Substance and Sexual Activity  . Alcohol use: Not Currently  . Drug use: Never  . Sexual activity: Not on file  Other Topics Concern  . Not on file  Social History Narrative  . Not on file   Social Determinants of Health   Financial Resource Strain:   . Difficulty of Paying Living Expenses: Not on file  Food  Insecurity:   . Worried About Programme researcher, broadcasting/film/video in the Last Year: Not on file  . Ran Out of Food in the Last Year: Not on file  Transportation Needs:   . Lack of Transportation (Medical): Not on file  . Lack of Transportation (Non-Medical): Not on file  Physical Activity:   . Days of Exercise per Week: Not on file  . Minutes of Exercise per Session: Not on file  Stress:   . Feeling of Stress : Not on file  Social Connections:   . Frequency of Communication with Friends and Family: Not on file  . Frequency of Social Gatherings with Friends and Family: Not on file  . Attends Religious Services: Not on file  . Active Member of Clubs or Organizations: Not on file  .  Attends Banker Meetings: Not on file  . Marital Status: Not on file  Intimate Partner Violence:   . Fear of Current or Ex-Partner: Not on file  . Emotionally Abused: Not on file  . Physically Abused: Not on file  . Sexually Abused: Not on file    Past Medical History, Surgical history, Social history, and Family history were reviewed and updated as appropriate.   Please see review of systems for further details on the patient's review from today.   Objective:   Physical Exam:  There were no vitals taken for this visit.  Physical Exam Constitutional:      General: She is not in acute distress. Musculoskeletal:        General: No deformity.  Neurological:     Mental Status: She is alert and oriented to person, place, and time.     Coordination: Coordination normal.  Psychiatric:        Attention and Perception: Attention and perception normal. She does not perceive auditory or visual hallucinations.        Mood and Affect: Mood normal. Mood is not anxious or depressed. Affect is not labile, blunt, angry or inappropriate.        Speech: Speech normal.        Behavior: Behavior normal.        Thought Content: Thought content normal. Thought content is not paranoid or delusional. Thought content does not include homicidal or suicidal ideation. Thought content does not include homicidal or suicidal plan.        Cognition and Memory: Cognition and memory normal.        Judgment: Judgment normal.     Comments: Insight intact     Lab Review:     Component Value Date/Time   NA 137 08/25/2018 0833   K 4.4 08/25/2018 0833   CL 105 08/25/2018 0833   CO2 27 08/25/2018 0833   GLUCOSE 85 08/25/2018 0833   BUN 22 08/25/2018 0833   CREATININE 1.58 (H) 08/25/2018 0833   CALCIUM 9.6 08/25/2018 0833   GFRNONAA 39 (L) 08/25/2018 0833   GFRAA 45 (L) 08/25/2018 0833    No results found for: WBC, RBC, HGB, HCT, PLT, MCV, MCH, MCHC, RDW, LYMPHSABS, MONOABS, EOSABS,  BASOSABS  Lithium Lvl  Date Value Ref Range Status  12/12/2019 0.9 0.6 - 1.2 mmol/L Final     No results found for: PHENYTOIN, PHENOBARB, VALPROATE, CBMZ   .res Assessment: Plan:   Will continue current plan of care since target signs and symptoms are well controlled without any tolerability issues. Continue Lithium 600 mg po QHS for mood stabilization. Continue Deplin 15 mg po qd for augmentation depression.  Pt not taking Ativan or Ambien. Will re-send script for pt to have available as needed if manic s/s recur.  Pt to f/u in 6 months or sooner if clinically indicated.  Patient advised to contact office with any questions, adverse effects, or acute worsening in signs and symptoms.  Rendy was seen today for follow-up.  Diagnoses and all orders for this visit:  Bipolar disorder, in full remission, most recent episode depressed (HCC) -     lithium carbonate (LITHOBID) 300 MG CR tablet; Take 2 tablets (600 mg total) by mouth at bedtime. -     L-Methylfolate 15 MG TABS; Take 1 tablet (15 mg total) by mouth daily.  High risk medication use -     Lithium level  Insomnia due to other mental disorder -     LORazepam (ATIVAN) 1 MG tablet; Take 1 tablet (1 mg total) by mouth daily as needed for anxiety. -     zolpidem (AMBIEN) 10 MG tablet; Take 0.5 tablets (5 mg total) by mouth at bedtime as needed for sleep.     Please see After Visit Summary for patient specific instructions.  Future Appointments  Date Time Provider Department Center  06/26/2020 11:00 AM Stevphen Meuse, [facility] None  11/12/2020 11:00 AM Corie Chiquito, [facility] None    Orders Placed This Encounter  Procedures  . Lithium level    -------------------------------

## 2020-06-23 LAB — LITHIUM LEVEL: Lithium Lvl: 0.7 mmol/L (ref 0.6–1.2)

## 2020-06-25 NOTE — Progress Notes (Signed)
Pt. Made aware.

## 2020-06-26 ENCOUNTER — Ambulatory Visit: Payer: 59 | Admitting: Psychiatry

## 2020-07-13 ENCOUNTER — Ambulatory Visit (INDEPENDENT_AMBULATORY_CARE_PROVIDER_SITE_OTHER): Payer: 59 | Admitting: Psychiatry

## 2020-07-13 ENCOUNTER — Other Ambulatory Visit: Payer: Self-pay

## 2020-07-13 DIAGNOSIS — F3176 Bipolar disorder, in full remission, most recent episode depressed: Secondary | ICD-10-CM

## 2020-07-13 NOTE — Progress Notes (Signed)
      [facility] Counselor/Therapist Progress Note  Patient ID: Makaylie Dedeaux, MRN: [id number],    Date: 07/13/2020  Time Spent: 50 minutes start time 12:07 PM end time 12:57 PM  Treatment Type: Individual Therapy  Reported Symptoms: sadness  Mental Status Exam:  Appearance:   Well Groomed     Behavior:  Appropriate  Motor:  Normal  Speech/Language:   Normal Rate  Affect:  Appropriate  Mood:  normal  Thought process:  normal  Thought content:    WNL  Sensory/Perceptual disturbances:    WNL  Orientation:  oriented to person, place, time/date and situation  Attention:  Good  Concentration:  Good  Memory:  WNL  Fund of knowledge:   Good  Insight:    Good  Judgment:   Good  Impulse Control:  Good   Risk Assessment: Danger to Self:  No Self-injurious Behavior: No Danger to Others: No Duty to Warn:no Physical Aggression / Violence:No  Access to Firearms a concern: No  Gang Involvement:No   Subjective: Patient was present for session.  She shared that they had to put 1 of their dogs down last week and that was very difficult for her.  She shared that it was handled well but still very hard.Everything else is going well.  She is happy at work.  She shared her husband is trying to encourage her to ask for a raise but she is very content and feels she is doing what she wants to do.  Shared their there are lots of stressors with her in-laws.  She explained that her mother-in-law planned 3 different 80th birthday celebrations for her father-in-law.  It was very overwhelming but they were able to get through it.  She also shared she is realizing that her sister-in-law is very selfish and that we will not change which means she and her husband be primary caretakers for his parents.  Patient explained she is feeling that she can handle that with an emotional disconnect so she is not as worried about the situation as her husband.  Patient shared she is just trying to support her husband  through the very difficult changes in their lives.  Her mother is also showing a decline in health but she is using her box to help her realize what she can do what she can do and did not take on things that she cannot do anything about.  Encouraged patient to continue practicing that tool as well as having a healthy diet and continuing her running.  Patient stated she is training for half marathon when she feels that has helped her mood tremendously.  Agreed to continue keeping patient is to an every other month basis.  Interventions: Solution-Oriented/Positive Psychology  Diagnosis:   ICD-10-CM   1. Bipolar affective disorder, depressed, in full remission (HCC)  F31.76     Plan: Patient is to use CBT and coping skills to decrease depression symptoms.  Patient is to continue using her box theory to keep perspective on her in-laws and her mother.  Patient is to continue training for half marathon and working on healthy diet plan. Long-term goal: Develop the ability to recognize accept and cope with feelings of depression Short-term goal: Increase the frequency of assertive behaviors to express needs desires and expectations  Stevphen Meuse, [facility]

## 2020-09-11 ENCOUNTER — Other Ambulatory Visit: Payer: Self-pay

## 2020-09-11 ENCOUNTER — Ambulatory Visit (INDEPENDENT_AMBULATORY_CARE_PROVIDER_SITE_OTHER): Payer: 59 | Admitting: Psychiatry

## 2020-09-11 DIAGNOSIS — F3176 Bipolar disorder, in full remission, most recent episode depressed: Secondary | ICD-10-CM | POA: Diagnosis not present

## 2020-09-11 NOTE — Progress Notes (Signed)
      [facility] Counselor/Therapist Progress Note  Patient ID: Isabel Benson, MRN: [id number],    Date: 09/11/2020  Time Spent: 52 minutes start time 8:11 AM end time 9:03 AM  Treatment Type: Individual Therapy  Reported Symptoms: anxiety, sadness  Mental Status Exam:  Appearance:   Well Groomed     Behavior:  Appropriate  Motor:  Normal  Speech/Language:   Normal Rate  Affect:  Appropriate  Mood:  normal  Thought process:  normal  Thought content:    WNL  Sensory/Perceptual disturbances:    WNL  Orientation:  oriented to person, place, time/date and situation  Attention:  Good  Concentration:  Good  Memory:  WNL  Fund of knowledge:   Good  Insight:    Good  Judgment:   Good  Impulse Control:  Good   Risk Assessment: Danger to Self:  No Self-injurious Behavior: No Danger to Others: No Duty to Warn:no Physical Aggression / Violence:No  Access to Firearms a concern: No  Gang Involvement:No   Subjective: Patient was present for session. She shared that she is preparing for the Holidays and is excited about her family time. She explained that she is having a new system being started at work and that is overwhelming but she feels good about getting through it. Her dad is still struggling cognitively and she is working to support her mother. She shared she has a new direct report that has made some big mistakes and she is having to figure out how to address it.  Patient did EMDR set on an email he sent, suds level 8, negative cognition "it is my fault" felt anxiety in her chest.  Patient was able to reduce suds level to 1.  She was able to recognize that she did everything she was supposed to do and that he was deflecting his poor choices on to her.  Different ways to manage the situation in the future appropriately were discussed with patient.  Interventions: Solution-Oriented/Positive Psychology and Eye Movement Desensitization and Reprocessing (EMDR)  Diagnosis:    ICD-10-CM   1. Bipolar affective disorder, depressed, in full remission (HCC)  F31.76     Plan: Patient is to use CBT and coping skills to decrease depression symptoms.  Patient is to continue exercising and playing her music to release emotions appropriately.  Patient is to take medication as directed.  Patient is to follow plans from session to deal with her new direct employee. Long-term goal: Develop the ability to recognize accept and cope with feelings of depression Short-term goal: Increase the frequency of assertive behaviors to express needs desires and expectations  Stevphen Meuse, [facility]

## 2020-09-26 DIAGNOSIS — R300 Dysuria: Secondary | ICD-10-CM | POA: Insufficient documentation

## 2020-09-26 DIAGNOSIS — B351 Tinea unguium: Secondary | ICD-10-CM | POA: Insufficient documentation

## 2020-09-26 DIAGNOSIS — R339 Retention of urine, unspecified: Secondary | ICD-10-CM | POA: Insufficient documentation

## 2020-09-26 DIAGNOSIS — E040 Nontoxic diffuse goiter: Secondary | ICD-10-CM | POA: Insufficient documentation

## 2020-09-26 DIAGNOSIS — N183 Chronic kidney disease, stage 3 unspecified: Secondary | ICD-10-CM | POA: Insufficient documentation

## 2020-10-24 ENCOUNTER — Other Ambulatory Visit: Payer: Self-pay

## 2020-10-24 ENCOUNTER — Ambulatory Visit (INDEPENDENT_AMBULATORY_CARE_PROVIDER_SITE_OTHER): Payer: 59 | Admitting: Psychiatry

## 2020-10-24 DIAGNOSIS — F3176 Bipolar disorder, in full remission, most recent episode depressed: Secondary | ICD-10-CM | POA: Diagnosis not present

## 2020-10-24 NOTE — Progress Notes (Signed)
      [facility] Counselor/Therapist Progress Note  Patient ID: Isabel Benson, MRN: [id number],    Date: 10/24/2020  Time Spent: 49 minutes start time 11:03 AM end time 11:52 AM  Treatment Type: Individual Therapy  Reported Symptoms: anxiety, sadness  Mental Status Exam:  Appearance:   Well Groomed     Behavior:  Appropriate  Motor:  Normal  Speech/Language:   Normal Rate  Affect:  Appropriate  Mood:  normal  Thought process:  normal  Thought content:    WNL  Sensory/Perceptual disturbances:    WNL  Orientation:  oriented to person, place, time/date and situation  Attention:  Good  Concentration:  Good  Memory:  WNL  Fund of knowledge:   Good  Insight:    Good  Judgment:   Good  Impulse Control:  Good   Risk Assessment: Danger to Self:  No Self-injurious Behavior: No Danger to Others: No Duty to Warn:no Physical Aggression / Violence:No  Access to Firearms a concern: No  Gang Involvement:No   Subjective: Patient was present for session.  She shared that things are still stressful with her work.  She went on to report she is trying to get things better.  She is feeling more relaxed with the 2 people that are doing their jobs and that is helping.  Patient discussed other things that are currently occurring with her work which are creating her stress.  Discussed different ways to talk her through the situation so that she can feel more empowered and feel better about things.  Patient shared that she is been running a lot to release her stress appropriately and has found that to be helpful she was encouraged to continue engaging in that behavior.  Patient was also encouraged to continue writing down the positive things so she can remind herself of those when she starts feeling overwhelmed.  Patient shared she is doing a lot of writing when it comes to her supervisee that is creating stress and she is just trying to manage it as appropriately as she can.  Patient also shared that  her mother is taking on more than she needs to and she is trying to remind herself that is not in her box and she has to release her mother's choices.  Patient was encouraged to feel positive about all the progress she is continuing to make.  She was encouraged to continue to focus on her self-care as well as continuing adding things into her life that she finds enjoyable.  Interventions: Cognitive Behavioral Therapy and Solution-Oriented/Positive Psychology  Diagnosis:   ICD-10-CM   1. Bipolar disorder, in full remission, most recent episode depressed (HCC)  F31.76     Plan: Patient is to use CBT and coping skills to decrease depression symptoms.  Patient is to continue exercising regularly.  Patient is to continue planning things that are positive to look forward to.  Patient is to remind herself to focus on the things that she can control fix and change. Long-term goal: Develop the ability to recognize accept and cope with feelings of depression Short-term goal: Increase the frequency of assertive behaviors to express needs desires and expectations  Stevphen Meuse, [facility]

## 2020-11-12 ENCOUNTER — Ambulatory Visit: Payer: 59 | Admitting: Psychiatry

## 2020-11-13 ENCOUNTER — Other Ambulatory Visit: Payer: Self-pay

## 2020-11-13 DIAGNOSIS — F3176 Bipolar disorder, in full remission, most recent episode depressed: Secondary | ICD-10-CM

## 2020-11-13 MED ORDER — L-METHYLFOLATE 15 MG PO TABS: 15.0000 mg | ORAL_TABLET | Freq: Every day | ORAL | 11 refills | Status: DC

## 2020-11-26 ENCOUNTER — Other Ambulatory Visit: Payer: Self-pay

## 2020-11-26 ENCOUNTER — Ambulatory Visit (INDEPENDENT_AMBULATORY_CARE_PROVIDER_SITE_OTHER): Payer: 59 | Admitting: Psychiatry

## 2020-11-26 ENCOUNTER — Encounter: Payer: Self-pay | Admitting: Psychiatry

## 2020-11-26 DIAGNOSIS — Z79899 Other long term (current) drug therapy: Secondary | ICD-10-CM | POA: Diagnosis not present

## 2020-11-26 DIAGNOSIS — F3176 Bipolar disorder, in full remission, most recent episode depressed: Secondary | ICD-10-CM

## 2020-11-26 MED ORDER — L-METHYLFOLATE 15 MG PO TABS
15.0000 mg | ORAL_TABLET | Freq: Every day | ORAL | 3 refills | Status: DC
Start: 2020-11-26 — End: 2021-11-21

## 2020-11-26 MED ORDER — LITHIUM CARBONATE ER 300 MG PO TBCR: 600.0000 mg | EXTENDED_RELEASE_TABLET | Freq: Every day | ORAL | 1 refills | Status: DC

## 2020-11-26 MED ORDER — OMEGA-3-ACID ETHYL ESTERS 1 G PO CAPS: 1.0000 g | ORAL_CAPSULE | Freq: Every day | ORAL | 3 refills | Status: DC

## 2020-11-26 NOTE — Progress Notes (Signed)
Isabel Benson [id number] 18-Feb-1971 50 y.o.  Subjective:   Patient ID:  Isabel Benson is a 50 y.o. (DOB Jul 20, 1971) female.  Chief Complaint:  Chief Complaint  Patient presents with  . Follow-up    Bipolar D/O    HPI Isabel Benson presents to the office today for follow-up of Bipolar Disorder. She reports that she has been sleeping well. She reports that her mood has been "fine" with a very slight dip" in mood in January after Christmas. Denies anxiety. Energy and motivation have been good. She reports that her appetite has been good. She is starting a new healthy eating plan this week. Concentration has been adequate. Denies SI.   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  Ran a half marathon in November and also a 5k. She has been enjoying walking and running with friends.   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 She reports that she was "touched" by people's testimonies.   Went to [employer] with a group of old friends.   Has not needed Ativan recently.  Review of Systems:  Review of Systems  Eyes:       Recent LASIK surgery and this went well.  Gastrointestinal: Negative.   Musculoskeletal: Negative for gait problem.  Neurological: Negative for tremors.  Psychiatric/Behavioral:       Please refer to HPI    She reports that she has not had COVID.   Medications: I have reviewed the patient's current medications.  Current Outpatient Medications  Medication Sig Dispense Refill  . glucosamine-chondroitin 500-400 MG tablet Take 1 tablet by mouth 3 (three) times daily.    Marland Kitchen ibuprofen (ADVIL) 200 MG tablet Take 200 mg by mouth every 6 (six) hours as needed.    . valACYclovir (VALTREX) 500 MG tablet 1 tab po twice a day at onset of symptoms    . L-Methylfolate 15 MG TABS Take 1 tablet (15 mg total) by mouth daily. 90 tablet 3  . lithium carbonate (LITHOBID) 300 MG CR tablet Take 2 tablets  (600 mg total) by mouth at bedtime. 180 tablet 1  . LORazepam (ATIVAN) 1 MG tablet Take 1 tablet (1 mg total) by mouth daily as needed for anxiety. 30 tablet 0  . omega-3 acid ethyl esters (LOVAZA) 1 g capsule Take 1 capsule (1 g total) by mouth daily. 90 capsule 3  . zolpidem (AMBIEN) 10 MG tablet Take 0.5 tablets (5 mg total) by mouth at bedtime as needed for sleep. 30 tablet 0   No current facility-administered medications for this visit.    Medication Side Effects: None  Allergies:  Allergies  Allergen Reactions  . Sudafed [Pseudoephedrine Hcl]     Past Medical History:  Diagnosis Date  . Chronic renal failure, stage 3 (moderate) (HCC)   . Multiple renal cysts     Family History  Problem Relation Age of Onset  . Mood Disorder Father   . Anxiety disorder Sister   . Depression Sister   . Depression Maternal Grandmother     Social History   Socioeconomic History  . Marital status: Married    Spouse name: Not on file  . Number of children: Not on file  . Years of education: Not on file  . Highest education level: Not on file  Occupational History  . Not on file  Tobacco Use  . Smoking status: Never Smoker  . Smokeless tobacco: Never Used  Substance and  Sexual Activity  . Alcohol use: Not Currently  . Drug use: Never  . Sexual activity: Not on file  Other Topics Concern  . Not on file  Social History Narrative  . Not on file   Social Determinants of Health   Financial Resource Strain: Not on file  Food Insecurity: Not on file  Transportation Needs: Not on file  Physical Activity: Not on file  Stress: Not on file  Social Connections: Not on file  Intimate Partner Violence: Not on file    Past Medical History, Surgical history, Social history, and Family history were reviewed and updated as appropriate.   Please see review of systems for further details on the patient's review from today.   Objective:   Physical Exam:  There were no vitals taken for  this visit.  Physical Exam Constitutional:      General: She is not in acute distress. Musculoskeletal:        General: No deformity.  Neurological:     Mental Status: She is alert and oriented to person, place, and time.     Coordination: Coordination normal.  Psychiatric:        Attention and Perception: Attention and perception normal. She does not perceive auditory or visual hallucinations.        Mood and Affect: Mood normal. Mood is not anxious or depressed. Affect is not labile, blunt, angry or inappropriate.        Speech: Speech normal.        Behavior: Behavior normal.        Thought Content: Thought content normal. Thought content is not paranoid or delusional. Thought content does not include homicidal or suicidal ideation. Thought content does not include homicidal or suicidal plan.        Cognition and Memory: Cognition and memory normal.        Judgment: Judgment normal.     Comments: Insight intact     Lab Review:     Component Value Date/Time   NA 137 08/25/2018 0833   K 4.4 08/25/2018 0833   CL 105 08/25/2018 0833   CO2 27 08/25/2018 0833   GLUCOSE 85 08/25/2018 0833   BUN 22 08/25/2018 0833   CREATININE 1.58 (H) 08/25/2018 0833   CALCIUM 9.6 08/25/2018 0833   GFRNONAA 39 (L) 08/25/2018 0833   GFRAA 45 (L) 08/25/2018 0833    No results found for: WBC, RBC, HGB, HCT, PLT, MCV, MCH, MCHC, RDW, LYMPHSABS, MONOABS, EOSABS, BASOSABS  Lithium Lvl  Date Value Ref Range Status  06/22/2020 0.7 0.6 - 1.2 mmol/L Final     No results found for: PHENYTOIN, PHENOBARB, VALPROATE, CBMZ   .res Assessment: Plan:   Will continue current plan of care since target signs and symptoms are well controlled without any tolerability issues. Will obtain Lithium level and TSH.  Continue Lithium CR 600 mg po QHS for mood stabilization.  Continue L-Methyl-folate 15 mg po qd for mood s/s. Continue Lovaza 1 gram daily.  Continue Ambien and lorazepam prn anxiety.  Pt to  follow-up in 6 months or sooner if clinically indicated.  Recommend continuing therapy with Stevphen Meuse, [facility].  Patient advised to contact office with any questions, adverse effects, or acute worsening in signs and symptoms.  Isabel Benson was seen today for follow-up.  Diagnoses and all orders for this visit:  Bipolar disorder, in full remission, most recent episode depressed (HCC) -     lithium carbonate (LITHOBID) 300 MG CR tablet; Take 2 tablets (600  mg total) by mouth at bedtime. -     omega-3 acid ethyl esters (LOVAZA) 1 g capsule; Take 1 capsule (1 g total) by mouth daily. -     L-Methylfolate 15 MG TABS; Take 1 tablet (15 mg total) by mouth daily.  High risk medication use -     Lithium level -     TSH     Please see After Visit Summary for patient specific instructions.  Future Appointments  Date Time Provider Department Center  12/24/2020 11:00 AM Stevphen Meuse, [facility] CP-CP None  05/29/2021  8:30 AM Corie Chiquito, [facility] CP-CP None    Orders Placed This Encounter  Procedures  . Lithium level  . TSH    -------------------------------

## 2020-12-07 DIAGNOSIS — N281 Cyst of kidney, acquired: Secondary | ICD-10-CM | POA: Insufficient documentation

## 2020-12-24 ENCOUNTER — Ambulatory Visit (INDEPENDENT_AMBULATORY_CARE_PROVIDER_SITE_OTHER): Payer: BC Managed Care – PPO | Admitting: Psychiatry

## 2020-12-24 ENCOUNTER — Other Ambulatory Visit: Payer: Self-pay

## 2020-12-24 DIAGNOSIS — F3176 Bipolar disorder, in full remission, most recent episode depressed: Secondary | ICD-10-CM

## 2020-12-24 NOTE — Progress Notes (Signed)
      [facility] Counselor/Therapist Progress Note  Patient ID: Isabel Benson, MRN: [id number],    Date: 12/24/2020  Time Spent: 47 minutes start time 11:13 AM end time 12 PM  Treatment Type: Individual Therapy  Reported Symptoms: anxiety  Mental Status Exam:  Appearance:   Well Groomed     Behavior:  Appropriate  Motor:  Normal  Speech/Language:   Normal Rate  Affect:  Appropriate  Mood:  normal  Thought process:  normal  Thought content:    WNL  Sensory/Perceptual disturbances:    WNL  Orientation:  oriented to person, place, time/date and situation  Attention:  Good  Concentration:  Good  Memory:  WNL  Fund of knowledge:   Good  Insight:    Good  Judgment:   Good  Impulse Control:  Good   Risk Assessment: Danger to Self:  No Self-injurious Behavior: No Danger to Others: No Duty to Warn:no Physical Aggression / Violence:No  Access to Firearms a concern: No  Gang Involvement:No   Subjective: Patient was present for session.  She shared that she is doing well overall. She explained that things are still stressful with her due to her employee not doing his job. She shared that she is documenting everything going on with him. She shared that she had to deal with merritt increases and hers was not comparable to what she needed to make.  Patient was able to communicate her disappointment with her boss which was progress for her.  Patient reported that she is realizing if she needs to move forward with her career somewhere else she can do that.  She went on to explain at this point she is going to enjoy her time off and just not put as much into her job as she has and see where it goes from there.  Patient was encouraged to continue to maintain perspective that she decides what she wants to do for her future.  Patient shared other details of her life and each seem to be going well.  She shared she is working on exercising and taking care of herself.  She is also planning trips  that she is looking forward to.  Patient will reschedule for 3 months out since she is continuing to maintain great progress.  Interventions: Solution-Oriented/Positive Psychology  Diagnosis:   ICD-10-CM   1. Bipolar disorder, in full remission, most recent episode depressed (HCC)  F31.76     Plan: Patient is to use CBT and coping skills to decrease anxiety.  And maintain reduced depression symptoms.  Patient will continue exercising and eating healthy.  She will continue maintaining perspective and working on self-care.  Patient will take medication as directed.  Patient will contact office if there is a change in status and she needs an appointment earlier than 3 months out.  Stevphen Meuse, [facility]

## 2021-03-18 ENCOUNTER — Ambulatory Visit: Payer: BC Managed Care – PPO | Admitting: Psychiatry

## 2021-03-18 ENCOUNTER — Other Ambulatory Visit: Payer: Self-pay

## 2021-03-18 DIAGNOSIS — F3176 Bipolar disorder, in full remission, most recent episode depressed: Secondary | ICD-10-CM

## 2021-03-18 NOTE — Progress Notes (Signed)
[facility] Counselor/Therapist Progress Note  Patient ID: Isabel Benson, MRN: [id number],    Date: 03/18/2021  Time Spent: 63 minutes start time 8:07 AM end time 9:10 AM  Treatment Type: Individual Therapy  Reported Symptoms: anxiety, sadness, frustration  Mental Status Exam:  Appearance:   Well Groomed     Behavior:  Appropriate  Motor:  Normal  Speech/Language:   Normal Rate  Affect:  Appropriate  Mood:  normal  Thought process:  normal  Thought content:    WNL  Sensory/Perceptual disturbances:    WNL  Orientation:  oriented to person, place, time/date, and situation  Attention:  Good  Concentration:  Good  Memory:  WNL  Fund of knowledge:   Good  Insight:    Good  Judgment:   Good  Impulse Control:  Good   Risk Assessment: Danger to Self:  No Self-injurious Behavior: No Danger to Others: No Duty to Warn:no Physical Aggression / Violence:No  Access to Firearms a concern: No  Gang Involvement:No   Subjective: Patient was present for session.  She shared that she had gone to the beach with her family and it was very stressful. She explained that taking care of her mother, dad and aunt and uncle were challenging.  Her dad is declining greatly and it was difficult to watch.  She also shared that her mother in law was very upset over them being at the beach for Father's Day.  She went on to share that it was very triggering and overwhelming for her to deal with her mother-in-law's rants.  She shared she has difficulty listening to her talk negatively about her husband especially, but her husband is used to the situation and just wants to not give it any energy or thoughts, so he chooses to ignore it.  Discussed different ways that she could handle it without being disrespectful but being able to set a limit that will help her keep from getting triggered or agitated.  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   Patient explained it creates lots of anxiety and extra stress for her.  Encouraged her to focus on the things that she can do something about and to set certain limits and expectations that he has to meet if he wants to be able to get his increase in salary.  Patient was able to think of some concrete things that she could put into place since there is an upcoming evaluation in September.  Patient also shared that her husband is making some comments about her potentially trying to find another job.  Discussed the pros and cons of that.  Patient was able to acknowledge she may not make as much money as she has in the past but she is having a better work life situation.  Patient reported that they are financially okay so she does not need another job necessarily.  Encouraged patient to recognize that she has seemed much better in this new job.  She is able to do her running connect with friends and is not working in the evenings and being very stressed and overwhelmed.  Reminded patient with her bipolar this sort of situation seems to be working much better for her and there is no reason to try and push things in a manner that are not healthy.  Also patient is currently on the right medication taking things as directed and so long as she continues  to do that she functions very well.  Patient was encouraged to remind her husband of those facts if she needs to because the most important thing is that she is able to stay at a good place and maintain her mental health.  Interventions: Solution-Oriented/Positive Psychology and Insight-Oriented  Diagnosis:   ICD-10-CM   1. Bipolar disorder, in full remission, most recent episode depressed (HCC)  F31.76       Plan: Patient is to use CBT and coping skills to continue decreasing anxiety and depression symptoms.  Patient is to work on limit setting with family members and at the work place.  Patient is to continue focusing on self-care including diet and exercise.   Patient is to take medication as directed.    Stevphen Meuse, [facility]

## 2021-05-01 ENCOUNTER — Other Ambulatory Visit: Payer: Self-pay

## 2021-05-01 ENCOUNTER — Ambulatory Visit (INDEPENDENT_AMBULATORY_CARE_PROVIDER_SITE_OTHER): Payer: BC Managed Care – PPO | Admitting: Psychiatry

## 2021-05-01 ENCOUNTER — Encounter: Payer: Self-pay | Admitting: Psychiatry

## 2021-05-01 VITALS — Wt 136.8 lb

## 2021-05-01 DIAGNOSIS — Z79899 Other long term (current) drug therapy: Secondary | ICD-10-CM | POA: Diagnosis not present

## 2021-05-01 DIAGNOSIS — R635 Abnormal weight gain: Secondary | ICD-10-CM | POA: Diagnosis not present

## 2021-05-01 DIAGNOSIS — F99 Mental disorder, not otherwise specified: Secondary | ICD-10-CM

## 2021-05-01 DIAGNOSIS — R6889 Other general symptoms and signs: Secondary | ICD-10-CM

## 2021-05-01 DIAGNOSIS — F3176 Bipolar disorder, in full remission, most recent episode depressed: Secondary | ICD-10-CM

## 2021-05-01 DIAGNOSIS — F5105 Insomnia due to other mental disorder: Secondary | ICD-10-CM

## 2021-05-01 MED ORDER — LORAZEPAM 1 MG PO TABS: 1.0000 mg | ORAL_TABLET | Freq: Every day | ORAL | 0 refills | Status: DC | PRN

## 2021-05-01 MED ORDER — LITHIUM CARBONATE ER 300 MG PO TBCR: 600.0000 mg | EXTENDED_RELEASE_TABLET | Freq: Every day | ORAL | 1 refills | Status: DC

## 2021-05-01 MED ORDER — ZOLPIDEM TARTRATE 10 MG PO TABS: 5.0000 mg | ORAL_TABLET | Freq: Every evening | ORAL | 0 refills | Status: DC | PRN

## 2021-05-01 NOTE — Progress Notes (Signed)
Isabel Benson [id number] 23-May-1971 50 y.o.  Subjective:   Patient ID:  Isabel Benson is a 50 y.o. (DOB 1971/05/30) female.  Chief Complaint:  Chief Complaint  Patient presents with   Medication Problem    R/O thyroid issue secondary to lithium   Follow-up    Bipolar D/O     HPI Isabel Benson presents to the office today for follow-up of Bipolar D/O and anxiety. She reports that she has not had her period in May, June, and July. Had period last week. She has been increasingly cold. She reports that a PA friend noticed that her throat appeared enlarged and that she was swallowing differently. She reports that her weight has been going up despite running 5 days a week up to 10 mile runs and no significant change in food intake. Started Fast Metabolism diet and lost 5 lbs in a week.   Mood has been stable. Sleeping well. No change in appetite. Energy and motivation have been good. Denies anxiety. Concentration is adequate. Denies SI.   Will seeing obgyn later this month.      Review of Systems:  Review of Systems  Endocrine: Positive for cold intolerance.  Musculoskeletal:  Negative for gait problem.  Skin:        Denies change in hair or skin  Neurological:  Negative for tremors.  Psychiatric/Behavioral:         Please refer to HPI   Medications: I have reviewed the patient's current medications.  Current Outpatient Medications  Medication Sig Dispense Refill   glucosamine-chondroitin 500-400 MG tablet Take 1 tablet by mouth 3 (three) times daily.     L-Methylfolate 15 MG TABS Take 1 tablet (15 mg total) by mouth daily. 90 tablet 3   lithium carbonate (LITHOBID) 300 MG CR tablet Take 2 tablets (600 mg total) by mouth at bedtime. 180 tablet 1   LORazepam (ATIVAN) 1 MG tablet Take 1 tablet (1 mg total) by mouth daily as needed for anxiety. 30 tablet 0   omega-3 acid ethyl esters (LOVAZA) 1 g capsule Take 1 capsule (1 g total) by mouth daily. 90 capsule 3   valACYclovir  (VALTREX) 500 MG tablet 1 tab po twice a day at onset of symptoms     zolpidem (AMBIEN) 10 MG tablet Take 0.5 tablets (5 mg total) by mouth at bedtime as needed for sleep. 30 tablet 0   No current facility-administered medications for this visit.    Medication Side Effects: Other: Possible thyroid issues  Allergies:  Allergies  Allergen Reactions   Sudafed [Pseudoephedrine Hcl]     Past Medical History:  Diagnosis Date   Chronic renal failure, stage 3 (moderate) (HCC)    Multiple renal cysts     Past Medical History, Surgical history, Social history, and Family history were reviewed and updated as appropriate.   Please see review of systems for further details on the patient's review from today.   Objective:   Physical Exam:  Wt 136 lb 12.8 oz (62.1 kg)   Physical Exam Constitutional:      General: She is not in acute distress. Musculoskeletal:        General: No deformity.  Neurological:     Mental Status: She is alert and oriented to person, place, and time.     Coordination: Coordination normal.  Psychiatric:        Attention and Perception: Attention and perception normal. She does not perceive auditory or visual hallucinations.  Mood and Affect: Mood normal. Mood is not anxious or depressed. Affect is not labile, blunt, angry or inappropriate.        Speech: Speech normal.        Behavior: Behavior normal.        Thought Content: Thought content normal. Thought content is not paranoid or delusional. Thought content does not include homicidal or suicidal ideation. Thought content does not include homicidal or suicidal plan.        Cognition and Memory: Cognition and memory normal.        Judgment: Judgment normal.     Comments: Insight intact    Lab Review:     Component Value Date/Time   NA 137 08/25/2018 0833   K 4.4 08/25/2018 0833   CL 105 08/25/2018 0833   CO2 27 08/25/2018 0833   GLUCOSE 85 08/25/2018 0833   BUN 22 08/25/2018 0833   CREATININE  1.58 (H) 08/25/2018 0833   CALCIUM 9.6 08/25/2018 0833   GFRNONAA 39 (L) 08/25/2018 0833   GFRAA 45 (L) 08/25/2018 0833    No results found for: WBC, RBC, HGB, HCT, PLT, MCV, MCH, MCHC, RDW, LYMPHSABS, MONOABS, EOSABS, BASOSABS  Lithium Lvl  Date Value Ref Range Status  06/22/2020 0.7 0.6 - 1.2 mmol/L Final     No results found for: PHENYTOIN, PHENOBARB, VALPROATE, CBMZ   .res Assessment: Plan:    Pt seen for 30 minutes and time spent discussing possible thyroid s/s. Case staffed with Dr Jennelle Human. Will order Thyroid Panel with TSH to rule out possible thyroid abnormality secondary to lithium. She plans to follow-up with medical provider later this month and will order labs so that results will be available at time of visit. Will also re-order Lithium level.  Will continue Lithobid 600 mg po QHS for mood stabilization.  Continue Delpin 15 mg po qd for mood s/s.  Continue Lovaza for mood s/s. Will send scripts for ambien and lorazepam prn to be put on file in case of exacerbation since scripts on file have expired.  Recommend continuing therapy with Stevphen Meuse, [facility].  Pt to follow-up in 6 months or sooner if clinically indicated.  Patient advised to contact office with any questions, adverse effects, or acute worsening in signs and symptoms.   Isabel Benson was seen today for medication problem and follow-up.  Diagnoses and all orders for this visit:  High risk medication use -     Thyroid Panel With TSH -     Lithium level  Cold sensitivity -     Thyroid Panel With TSH  Abnormal weight gain -     Thyroid Panel With TSH  Bipolar disorder, in full remission, most recent episode depressed (HCC) -     lithium carbonate (LITHOBID) 300 MG CR tablet; Take 2 tablets (600 mg total) by mouth at bedtime.  Insomnia due to other mental disorder -     zolpidem (AMBIEN) 10 MG tablet; Take 0.5 tablets (5 mg total) by mouth at bedtime as needed for sleep. -     LORazepam (ATIVAN) 1 MG  tablet; Take 1 tablet (1 mg total) by mouth daily as needed for anxiety.    Please see After Visit Summary for patient specific instructions.  Future Appointments  Date Time Provider Department Center  05/23/2021  8:00 AM Stevphen Meuse, [facility] CP-CP None  11/01/2021  8:30 AM Corie Chiquito, [facility]NP CP-CP None    Orders Placed This Encounter  Procedures   Thyroid Panel With TSH  Lithium level     -------------------------------

## 2021-05-04 LAB — THYROID PANEL WITH TSH
Free Thyroxine Index: 2.7 (ref 1.4–3.8)
T3 Uptake: 33 % (ref 22–35)
T4, Total: 8.3 ug/dL (ref 5.1–11.9)
TSH: 1.44 mIU/L

## 2021-05-04 LAB — LITHIUM LEVEL: Lithium Lvl: 0.9 mmol/L (ref 0.6–1.2)

## 2021-05-06 ENCOUNTER — Encounter: Payer: Self-pay | Admitting: Psychiatry

## 2021-05-22 ENCOUNTER — Ambulatory Visit: Payer: BC Managed Care – PPO | Admitting: Psychiatry

## 2021-05-23 ENCOUNTER — Ambulatory Visit (INDEPENDENT_AMBULATORY_CARE_PROVIDER_SITE_OTHER): Payer: BC Managed Care – PPO | Admitting: Psychiatry

## 2021-05-23 ENCOUNTER — Encounter: Payer: Self-pay | Admitting: Psychiatry

## 2021-05-23 ENCOUNTER — Other Ambulatory Visit: Payer: Self-pay

## 2021-05-23 DIAGNOSIS — F3176 Bipolar disorder, in full remission, most recent episode depressed: Secondary | ICD-10-CM

## 2021-05-23 NOTE — Progress Notes (Signed)
      [facility] Counselor/Therapist Progress Note  Patient ID: Isabel Benson, MRN: [id number],    Date: 05/23/2021  Time Spent: 48 minutes start time 8:12 AM end time 9:00 AM  Treatment Type: Individual Therapy  Reported Symptoms: anxiety, medical issues  Mental Status Exam:  Appearance:   Well Groomed     Behavior:  Appropriate  Motor:  Normal  Speech/Language:   Normal Rate  Affect:  Appropriate  Mood:  normal  Thought process:  normal  Thought content:    WNL  Sensory/Perceptual disturbances:    WNL  Orientation:  oriented to person, place, time/date, and situation  Attention:  Good  Concentration:  Good  Memory:  WNL  Fund of knowledge:   Good  Insight:    Good  Judgment:   Good  Impulse Control:  Good   Risk Assessment: Danger to Self:  No Self-injurious Behavior: No Danger to Others: No Duty to Warn:no Physical Aggression / Violence:No  Access to Firearms a concern: No  Gang Involvement:No   Subjective: Patient was present for session.  She shared that she is having some physical issues and that is creating stress for her.  She shared overall she is doing well.  She explained that things are still stressful with her in laws but she is managing it.  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  Patient was able to share that she feels she is managing things appropriately and working on her self talk to make sure she focuses on the things that she can control fix and change.  Patient was able to acknowledge she does not feel that his behaviors can change but that regardless other people are going to see the truth so she is focusing on what she can do.  Patient was encouraged to continue working on that skill and staying focused on what she can do with the situation.  Patient shared she is also having some medical issues.  Encouraged her to continue working with providers on the different situations that have been concerning for her.  Patient was encouraged to continue  just coming to treatment every 3 months since she seems to be stable at this time.  Interventions: Cognitive Behavioral Therapy and Solution-Oriented/Positive Psychology  Diagnosis:   ICD-10-CM   1. Bipolar disorder, in full remission, most recent episode depressed (HCC)  F31.76       Plan: Patient is to use CBT and coping skills to continue managing emotions appropriately.  Patient is to focus on things that she can control fix and change.  Patient is to take medication as directed.  Patient is to see other appropriate providers for medical issues.   Stevphen Meuse, [facility]

## 2021-05-29 ENCOUNTER — Ambulatory Visit: Payer: Self-pay | Admitting: Psychiatry

## 2021-07-03 ENCOUNTER — Encounter: Payer: Self-pay | Admitting: Family Medicine

## 2021-07-03 ENCOUNTER — Ambulatory Visit: Payer: Self-pay

## 2021-07-03 ENCOUNTER — Ambulatory Visit (INDEPENDENT_AMBULATORY_CARE_PROVIDER_SITE_OTHER): Payer: BC Managed Care – PPO | Admitting: Family Medicine

## 2021-07-03 VITALS — Ht 64.0 in | Wt 140.0 lb

## 2021-07-03 DIAGNOSIS — M25551 Pain in right hip: Secondary | ICD-10-CM

## 2021-07-03 DIAGNOSIS — M84350A Stress fracture, pelvis, initial encounter for fracture: Secondary | ICD-10-CM

## 2021-07-03 NOTE — Patient Instructions (Signed)
Nice to meet you Please try the exercises   Please try heat  I will call with the results.  Please send me a message in MyChart with any questions or updates.  Please see me back in 3-4 weeks.   --Dr. Jordan Likes

## 2021-07-03 NOTE — Progress Notes (Signed)
  Isabel Benson - 50 y.o. female MRN [id number]  Date of birth: [birth year]  SUBJECTIVE:  Including CC & ROS.  No chief complaint on file.   Isabel Benson is a 50 y.o. female that is presenting with right hip pain.  The pain has been ongoing for about a month.  She tries to run and walk for exercise.  No improvement with rest and no history of stress fracture.   Review of Systems See HPI   HISTORY: Past Medical, Surgical, Social, and Family History Reviewed & Updated per EMR.   Pertinent Historical Findings include:  Past Medical History:  Diagnosis Date   Chronic renal failure, stage 3 (moderate) (HCC)    Multiple renal cysts     History reviewed. No pertinent surgical history.  Family History  Problem Relation Age of Onset   Mood Disorder Father    Anxiety disorder Sister    Depression Sister    Depression Maternal Grandmother     Social History   Socioeconomic History   Marital status: Married    Spouse name: Not on file   Number of children: Not on file   Years of education: Not on file   Highest education level: Not on file  Occupational History   Not on file  Tobacco Use   Smoking status: Never   Smokeless tobacco: Never  Substance and Sexual Activity   Alcohol use: Not Currently   Drug use: Never   Sexual activity: Not on file  Other Topics Concern   Not on file  Social History Narrative   Not on file   Social Determinants of Health   Financial Resource Strain: Not on file  Food Insecurity: Not on file  Transportation Needs: Not on file  Physical Activity: Not on file  Stress: Not on file  Social Connections: Not on file  Intimate Partner Violence: Not on file     PHYSICAL EXAM:  VS: Ht 5\' 4"  (1.626 m)   Wt 140 lb (63.5 kg)   LMP 06/06/2021   BMI 24.03 kg/m  Physical Exam Gen: NAD, alert, cooperative with exam, well-appearing   Limited ultrasound: Right hip:  No effusion or changes of the labrum or hip joint. Normal-appearing  ASIS. Increased hyperemia over the lateral pelvic rim when compared to the contralateral side.  Summary: Findings are concerning for stress changes  Ultrasound and interpretation by 06/08/2021, MD     ASSESSMENT & PLAN:   Stress fx pelvis Findings are concerning for stress changes of the pelvis.  Possibly from an unstable hip but no significant effusion appreciated on exam today. -Counseled on home exercise therapy and supportive care. -X-ray. -Could consider injection physical therapy or further imaging.

## 2021-07-04 ENCOUNTER — Other Ambulatory Visit: Payer: Self-pay

## 2021-07-04 ENCOUNTER — Ambulatory Visit (HOSPITAL_BASED_OUTPATIENT_CLINIC_OR_DEPARTMENT_OTHER)
Admission: RE | Admit: 2021-07-04 | Discharge: 2021-07-04 | Disposition: A | Discharge status: Home / Self Care | Payer: BC Managed Care – PPO | Source: Ambulatory Visit | Attending: Family Medicine | Admitting: Family Medicine

## 2021-07-04 DIAGNOSIS — M84350A Stress fracture, pelvis, initial encounter for fracture: Secondary | ICD-10-CM | POA: Diagnosis not present

## 2021-07-04 NOTE — Assessment & Plan Note (Signed)
Findings are concerning for stress changes of the pelvis.  Possibly from an unstable hip but no significant effusion appreciated on exam today. -Counseled on home exercise therapy and supportive care. -X-ray. -Could consider injection physical therapy or further imaging.

## 2021-07-05 ENCOUNTER — Encounter: Payer: Self-pay | Admitting: Family Medicine

## 2021-07-08 ENCOUNTER — Telehealth: Payer: Self-pay | Admitting: Family Medicine

## 2021-07-08 NOTE — Telephone Encounter (Signed)
Informed of results.   Myra Rude, MD [facility] 07/08/2021, 8:59 AM

## 2021-07-26 ENCOUNTER — Ambulatory Visit (INDEPENDENT_AMBULATORY_CARE_PROVIDER_SITE_OTHER): Payer: BC Managed Care – PPO | Admitting: Family Medicine

## 2021-07-26 ENCOUNTER — Encounter: Payer: Self-pay | Admitting: Family Medicine

## 2021-07-26 DIAGNOSIS — M84350D Stress fracture, pelvis, subsequent encounter for fracture with routine healing: Secondary | ICD-10-CM | POA: Diagnosis not present

## 2021-07-26 NOTE — Progress Notes (Signed)
  Isabel Benson - 50 y.o. female MRN [id number]  Date of birth: 1971-04-19  SUBJECTIVE:  Including CC & ROS.  No chief complaint on file.   Isabel Benson is a 50 y.o. female that is following up for her right hip pain.  She has been doing the home exercises and therapy and reports improvement of her pain.  She feels unstable at times but for the most part can walk normally.   Review of Systems See HPI   HISTORY: Past Medical, Surgical, Social, and Family History Reviewed & Updated per EMR.   Pertinent Historical Findings include:  Past Medical History:  Diagnosis Date   Chronic renal failure, stage 3 (moderate) (HCC)    Multiple renal cysts     History reviewed. No pertinent surgical history.  Family History  Problem Relation Age of Onset   Mood Disorder Father    Anxiety disorder Sister    Depression Sister    Depression Maternal Grandmother     Social History   Socioeconomic History   Marital status: Married    Spouse name: Not on file   Number of children: Not on file   Years of education: Not on file   Highest education level: Not on file  Occupational History   Not on file  Tobacco Use   Smoking status: Never   Smokeless tobacco: Never  Substance and Sexual Activity   Alcohol use: Not Currently   Drug use: Never   Sexual activity: Not on file  Other Topics Concern   Not on file  Social History Narrative   Not on file   Social Determinants of Health   Financial Resource Strain: Not on file  Food Insecurity: Not on file  Transportation Needs: Not on file  Physical Activity: Not on file  Stress: Not on file  Social Connections: Not on file  Intimate Partner Violence: Not on file     PHYSICAL EXAM:  VS: BP 104/60 (BP Location: Left Arm, Patient Position: Sitting)   Ht 5\' 4"  (1.626 m)   Wt 140 lb (63.5 kg)   BMI 24.03 kg/m  Physical Exam Gen: NAD, alert, cooperative with exam, well-appearing      ASSESSMENT & PLAN:   Stress fx  pelvis Continues to get improvement.  Is able to walk with no pain today. -Counseled on home exercise therapy and supportive care. -Can start introducing short bouts of running back to her regimen. -Could consider physical therapy or further imaging.

## 2021-07-26 NOTE — Assessment & Plan Note (Signed)
Continues to get improvement.  Is able to walk with no pain today. -Counseled on home exercise therapy and supportive care. -Can start introducing short bouts of running back to her regimen. -Could consider physical therapy or further imaging.

## 2021-08-21 ENCOUNTER — Encounter: Payer: Self-pay | Admitting: Family Medicine

## 2021-08-26 ENCOUNTER — Ambulatory Visit (INDEPENDENT_AMBULATORY_CARE_PROVIDER_SITE_OTHER): Payer: BC Managed Care – PPO | Admitting: Psychiatry

## 2021-08-26 ENCOUNTER — Other Ambulatory Visit: Payer: Self-pay

## 2021-08-26 DIAGNOSIS — F3176 Bipolar disorder, in full remission, most recent episode depressed: Secondary | ICD-10-CM

## 2021-08-26 NOTE — Progress Notes (Signed)
      [facility] Counselor/Therapist Progress Note  Patient ID: Isabel Benson, MRN: [id number],    Date: 08/26/2021  Time Spent: 55 minutes start time 1:07 PM end time 2:02 PM  Treatment Type: Individual Therapy  Reported Symptoms: chronic pain, sadness, anxiety  Mental Status Exam:  Appearance:   Well Groomed     Behavior:  Appropriate  Motor:  Normal  Speech/Language:   Normal Rate  Affect:  Appropriate  Mood:  normal  Thought process:  normal  Thought content:    WNL  Sensory/Perceptual disturbances:    WNL  Orientation:  oriented to person, place, time/date, and situation  Attention:  Good  Concentration:  Good  Memory:  WNL  Fund of knowledge:   Good  Insight:    Good  Judgment:   Good  Impulse Control:  Good   Risk Assessment: Danger to Self:  No Self-injurious Behavior: No Danger to Others: No Duty to Warn:no Physical Aggression / Violence:No  Access to Firearms a concern: No  Gang Involvement:No   Subjective: Patient was present for session.  She shared she is continuing to make progress but having issues physically with her hip. She shared that it has been sad that she has not been able to physically do what she wants to do.  Discussed what that has been like for her and ways that she can manage those emotions appropriately.  Patient also discussed there have been some shifts with her husband and his work which has created anxiety for him and her due to finances.  It has also made her think through if she wants to stay at her current position or seek another option.  Allowed patient time to process what would be best for her at this time.  She was able to develop a plan that she felt would be helpful.  Patient was encouraged to recognize that it is best not to make a quick decision especially since she seems to be pleased at her current position even though it may not pay her as well as other options.  She shared that they have got a new puppy and she feels that  that has helped her mood as well as her husband's.  She was encouraged to continue taking time to enjoy her new puppy and relax and to get back to running and exercise regularly as soon as she is able to.  Interventions: Cognitive Behavioral Therapy and Solution-Oriented/Positive Psychology  Diagnosis:   ICD-10-CM   1. Bipolar disorder, in full remission, most recent episode depressed (HCC)  F31.76       Plan: Patient is to use CBT and coping skills to decrease mood issues.  Patient is to follow plans from session concerning her work situation.  Patient is to continue working with her providers on her physical issues.  Patient is to get back to exercising as soon as she is able.  Patient is to focus on the things that she can do rather than what she cannot.  Patient is to continue taking medication as directed.  Stevphen Meuse, [facility]

## 2021-11-01 ENCOUNTER — Ambulatory Visit: Payer: BC Managed Care – PPO | Admitting: Psychiatry

## 2021-11-01 ENCOUNTER — Other Ambulatory Visit: Payer: Self-pay

## 2021-11-18 ENCOUNTER — Ambulatory Visit: Payer: BC Managed Care – PPO | Admitting: Psychiatry

## 2021-11-21 ENCOUNTER — Ambulatory Visit (INDEPENDENT_AMBULATORY_CARE_PROVIDER_SITE_OTHER): Payer: BC Managed Care – PPO | Admitting: Psychiatry

## 2021-11-21 ENCOUNTER — Encounter: Payer: Self-pay | Admitting: Psychiatry

## 2021-11-21 ENCOUNTER — Other Ambulatory Visit: Payer: Self-pay

## 2021-11-21 DIAGNOSIS — F99 Mental disorder, not otherwise specified: Secondary | ICD-10-CM

## 2021-11-21 DIAGNOSIS — F5105 Insomnia due to other mental disorder: Secondary | ICD-10-CM

## 2021-11-21 DIAGNOSIS — F3176 Bipolar disorder, in full remission, most recent episode depressed: Secondary | ICD-10-CM | POA: Diagnosis not present

## 2021-11-21 MED ORDER — L-METHYLFOLATE 15 MG PO TABS: 15.0000 mg | ORAL_TABLET | Freq: Every day | ORAL | 3 refills | Status: DC

## 2021-11-21 MED ORDER — ZOLPIDEM TARTRATE 10 MG PO TABS: 5.0000 mg | ORAL_TABLET | Freq: Every evening | ORAL | 0 refills | Status: DC | PRN

## 2021-11-21 MED ORDER — LITHIUM CARBONATE ER 300 MG PO TBCR: 600.0000 mg | EXTENDED_RELEASE_TABLET | Freq: Every day | ORAL | 1 refills | Status: DC

## 2021-11-21 MED ORDER — OMEGA-3-ACID ETHYL ESTERS 1 G PO CAPS: 1.0000 g | ORAL_CAPSULE | Freq: Every day | ORAL | 3 refills | Status: DC

## 2021-11-21 MED ORDER — LORAZEPAM 1 MG PO TABS: 1.0000 mg | ORAL_TABLET | Freq: Every day | ORAL | 0 refills | Status: DC | PRN

## 2021-11-21 NOTE — Progress Notes (Signed)
Ellan Tess ?[id number] ?1970/10/10 ?51 y.o. ? ?Subjective:  ? ?Patient ID:  Isabel Benson is a 51 y.o. (DOB 07-19-1971) female. ? ?Chief Complaint:  ?Chief Complaint  ?Patient presents with  ? Follow-up  ?  Bipolar D/O  ? ? ?HPI ?Isabel Benson presents to the office today for follow-up of mood disturbance. She reports, "everything is good." She reports that her mood has been stable overall. Noticed a slight "dip" in mood after the holidays. Denies manic s/s. Denies anxiety. Energy and motivation have been good. Sleeping well and reports that her sleep score has been good. Appetite has been "too good." She reports adequate concentration. Denies SI.  ? ?She has started running again after having hip pain in August. She reports that she did hip exercises twice daily from sports medicine specialist. She got new inserts and this has helped. She reports that she will be running a half marathon soon. She enjoys running with her friends.  ? ?Work has been going well.  ?  ?Has not needed Lorazepam prn or Ambien prn recently. ? ?Review of Systems:  ?Review of Systems  ?Genitourinary:  Positive for urgency. Negative for dysuria.  ?     Reports possible UTI. Has had groin pain and some fatigue.  ?Musculoskeletal:  Negative for gait problem.  ?Neurological:  Negative for tremors.  ?Psychiatric/Behavioral:    ?     Please refer to HPI  ? ?Medications: I have reviewed the patient's current medications. ? ?Current Outpatient Medications  ?Medication Sig Dispense Refill  ? Calcium Carbonate (CALCIUM 500 PO) Take by mouth.    ? cholecalciferol (VITAMIN D3) 25 MCG (1000 UNIT) tablet Take 2,000 Units by mouth daily.    ? glucosamine-chondroitin 500-400 MG tablet Take 1 tablet by mouth 3 (three) times daily.    ? L-Methylfolate 15 MG TABS Take 1 tablet (15 mg total) by mouth daily. 90 tablet 3  ? lithium carbonate (LITHOBID) 300 MG CR tablet Take 2 tablets (600 mg total) by mouth at bedtime. 180 tablet 1  ? LORazepam (ATIVAN) 1 MG  tablet Take 1 tablet (1 mg total) by mouth daily as needed for anxiety. 30 tablet 0  ? omega-3 acid ethyl esters (LOVAZA) 1 g capsule Take 1 capsule (1 g total) by mouth daily. 90 capsule 3  ? valACYclovir (VALTREX) 500 MG tablet 1 tab po twice a day at onset of symptoms    ? zolpidem (AMBIEN) 10 MG tablet Take 0.5 tablets (5 mg total) by mouth at bedtime as needed for sleep. 30 tablet 0  ? ?No current facility-administered medications for this visit.  ? ? ?Medication Side Effects: Other: Occ hand "jerk " ? ?Allergies:  ?Allergies  ?Allergen Reactions  ? Sudafed [Pseudoephedrine Hcl]   ? ? ?Past Medical History:  ?Diagnosis Date  ? Chronic renal failure, stage 3 (moderate) (HCC)   ? Multiple renal cysts   ? ? ?Past Medical History, Surgical history, Social history, and Family history were reviewed and updated as appropriate.  ? ?Please see review of systems for further details on the patient's review from today.  ? ?Objective:  ? ?Physical Exam:  ?Wt 141 lb (64 kg)   BMI 24.20 kg/m?  ? ?Physical Exam ?Constitutional:   ?   General: She is not in acute distress. ?Musculoskeletal:     ?   General: No deformity.  ?Neurological:  ?   Mental Status: She is alert and oriented to person, place, and time.  ?   Coordination: Coordination  normal.  ?Psychiatric:     ?   Attention and Perception: Attention and perception normal. She does not perceive auditory or visual hallucinations.     ?   Mood and Affect: Mood normal. Mood is not anxious or depressed. Affect is not labile, blunt, angry or inappropriate.     ?   Speech: Speech normal.     ?   Behavior: Behavior normal.     ?   Thought Content: Thought content normal. Thought content is not paranoid or delusional. Thought content does not include homicidal or suicidal ideation. Thought content does not include homicidal or suicidal plan.     ?   Cognition and Memory: Cognition and memory normal.     ?   Judgment: Judgment normal.  ?   Comments: Insight intact  ? ? ?Lab  Review:  ?   ?Component Value Date/Time  ? NA 137 08/25/2018 0833  ? K 4.4 08/25/2018 0833  ? CL 105 08/25/2018 0833  ? CO2 27 08/25/2018 0833  ? GLUCOSE 85 08/25/2018 0833  ? BUN 22 08/25/2018 0833  ? CREATININE 1.58 (H) 08/25/2018 4166  ? CALCIUM 9.6 08/25/2018 0833  ? GFRNONAA 39 (L) 08/25/2018 0630  ? GFRAA 45 (L) 08/25/2018 1601  ? ? ?No results found for: WBC, RBC, HGB, HCT, PLT, MCV, MCH, MCHC, RDW, LYMPHSABS, MONOABS, EOSABS, BASOSABS ? ?Lithium Lvl  ?Date Value Ref Range Status  ?05/03/2021 0.9 0.6 - 1.2 mmol/L Final  ?  ? ?No results found for: PHENYTOIN, PHENOBARB, VALPROATE, CBMZ  ? ?.res ?Assessment: Plan:   ? ?Pt seen for 30 minutes and time spent discussing treatment plan. Reviewed creatinine level from last nephrologist visit and she reports that she has upcoming nephrology re-check. Discussed re-checking Lithium level. She reports that she will ask nephrologist to include Lithium level in next scheduled labs and otherwise prefers to wait several months before lithium re-check.  ?Continue Lithium CR 600 mg at bedtime for mood stabilization.  ?Continue L-Methylfolate 15 mg po qd for depression.  ?Continue Lovaza 1 gram daily for mood s/s.  ?Will re-order Ambien prn and Ativan prn for her to have available in case she begins to experience s/s of mania.  ?Pt to follow-up in 6 months or sooner if clinically indicated.  ?Recommend continuing therapy with Stevphen Meuse, [facility].  ?Patient advised to contact office with any questions, adverse effects, or acute worsening in signs and symptoms. ? ? ?Isabel Benson was seen today for follow-up. ? ?Diagnoses and all orders for this visit: ? ?Bipolar disorder, in full remission, most recent episode depressed (HCC) ?-     lithium carbonate (LITHOBID) 300 MG CR tablet; Take 2 tablets (600 mg total) by mouth at bedtime. ?-     L-Methylfolate 15 MG TABS; Take 1 tablet (15 mg total) by mouth daily. ?-     omega-3 acid ethyl esters (LOVAZA) 1 g capsule; Take 1 capsule (1 g  total) by mouth daily. ? ?Insomnia due to other mental disorder ?-     LORazepam (ATIVAN) 1 MG tablet; Take 1 tablet (1 mg total) by mouth daily as needed for anxiety. ?-     zolpidem (AMBIEN) 10 MG tablet; Take 0.5 tablets (5 mg total) by mouth at bedtime as needed for sleep. ? ?  ? ?Please see After Visit Summary for patient specific instructions. ? ?Future Appointments  ?Date Time Provider Department Center  ?01/07/2022 12:00 PM Stevphen Meuse, [facility] None  ? ? ?No orders of the defined  types were placed in this encounter. ? ? ?------------------------------- ?

## 2022-01-07 ENCOUNTER — Ambulatory Visit (INDEPENDENT_AMBULATORY_CARE_PROVIDER_SITE_OTHER): Payer: BC Managed Care – PPO | Admitting: Psychiatry

## 2022-01-07 DIAGNOSIS — F3176 Bipolar disorder, in full remission, most recent episode depressed: Secondary | ICD-10-CM | POA: Diagnosis not present

## 2022-01-07 NOTE — Progress Notes (Signed)
?      [facility] Counselor/Therapist Progress Note ? ?Patient ID: Isabel Benson, MRN: [id number],   ? ?Date: 01/07/2022 ? ?Time Spent: 50 minutes start time 12:08 PM end time 12:58 PM ? ?Treatment Type: Individual Therapy ? ?Reported Symptoms: anxiety ? ?Mental Status Exam: ? ?Appearance:   Well Groomed     ?Behavior:  Appropriate  ?Motor:  Normal  ?Speech/Language:   Normal Rate  ?Affect:  Appropriate  ?Mood:  normal  ?Thought process:  normal  ?Thought content:    WNL  ?Sensory/Perceptual disturbances:    WNL  ?Orientation:  oriented to person, place, time/date, and situation  ?Attention:  Good  ?Concentration:  Good  ?Memory:  WNL  ?Fund of knowledge:   Good  ?Insight:    Good  ?Judgment:   Good  ?Impulse Control:  Good  ? ?Risk Assessment: ?Danger to Self:  No ?Self-injurious Behavior: No ?Danger to Others: No ?Duty to Warn:no ?Physical Aggression / Violence:No  ?Access to Firearms a concern: No  ?Gang Involvement:No  ? ?Subjective: Patient was present for session. She shared she is doing well overall.  She is exercising again and feeling good about that.  Patient reported that she is struggling with some things going on with her father who is having declined mentally.  Discussed different ways that she can support her mother and take care of herself through the transition.  Patient was able to report reconnecting with a friend that lives close to her in-laws [facility] house and shared that has been very positive and it gives her something to do when she needs to as gate from some of the family drama that occurs.  Patient was encouraged to feel good about that connection and to make sure she uses it as needed.  Patient went on to share that she is feeling fairly good with work overall.  She is thinking through some options because of different situations but feels the best thing for her at this point is possibly to stay where she is.  Discussed the fact that the stress is lower there and since stress with work and  the past has not negatively impacted her if she can stay where she is that might be the best option for her.  Patient acknowledged that she does not want to get in an overwhelming situation that could create any disruption in her mental health.  Patient was encouraged to continue doing the things that are working for her and focusing on her self-care. ? ?Interventions: Solution-Oriented/Positive Psychology ? ?Diagnosis: ?  ICD-10-CM   ?1. Bipolar disorder, in full remission, most recent episode depressed (HCC)  F31.76   ?  ? ? ?Plan: Patient is to use CBT and coping skills to decrease mood issues.  Patient is to follow plans from her family situation.  Patient is to continue working with her providers on her physical issues.  Patient is to continue exercising to release negative emotions appropriately.  Patient is to focus on the things that she can do rather than what she cannot.  Patient is to continue taking medication as directed. ? ?Stevphen Meuse, [facility] ? ? ? ? ? ? ? ? ? ? ? ? ? ? ? ? ? ? ?

## 2022-02-26 ENCOUNTER — Other Ambulatory Visit: Payer: Self-pay

## 2022-02-26 DIAGNOSIS — F3176 Bipolar disorder, in full remission, most recent episode depressed: Secondary | ICD-10-CM

## 2022-02-26 MED ORDER — OMEGA-3-ACID ETHYL ESTERS 1 G PO CAPS: 1.0000 g | ORAL_CAPSULE | Freq: Every day | ORAL | 3 refills | Status: DC

## 2022-03-17 ENCOUNTER — Telehealth: Payer: Self-pay

## 2022-04-15 ENCOUNTER — Ambulatory Visit (INDEPENDENT_AMBULATORY_CARE_PROVIDER_SITE_OTHER): Payer: BC Managed Care – PPO | Admitting: Psychiatry

## 2022-04-15 DIAGNOSIS — F3176 Bipolar disorder, in full remission, most recent episode depressed: Secondary | ICD-10-CM | POA: Diagnosis not present

## 2022-04-15 NOTE — Progress Notes (Signed)
      [facility] Counselor/Therapist Progress Note  Patient ID: Isabel Benson, MRN: [id number],    Date: 04/15/2022  Time Spent: 50 minutes start time 12:07 PM end time 12:57 PM  Treatment Type: Individual Therapy  Reported Symptoms: anxiety, sadness  Mental Status Exam:  Appearance:   Well Groomed     Behavior:  Appropriate  Motor:  Normal  Speech/Language:   Normal Rate  Affect:  Appropriate  Mood:  normal  Thought process:  normal  Thought content:    WNL  Sensory/Perceptual disturbances:    WNL  Orientation:  oriented to person, place, time/date, and situation  Attention:  Good  Concentration:  Good  Memory:  WNL  Fund of knowledge:   Good  Insight:    Good  Judgment:   Good  Impulse Control:  Good   Risk Assessment: Danger to Self:  No Self-injurious Behavior: No Danger to Others: No Duty to Warn:no Physical Aggression / Violence:No  Access to Firearms a concern: No  Gang Involvement:No   Subjective: Patient was present for session.  She shared that the biggest stressor in her life is her employee who is late to things and creates extra stress for her.  She has starting to look into other options. She had to terminate someone for lying to her.  Patient stated at this point she likes her job overall but these moments of having to deal with employees is very difficult and triggering for her.  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  Patient stated that overall things are good with her family and her in-laws.  There have been bumps but she has been able to manage them appropriately.  Patient stated she started working with a nutritionist due to weight gain that is bothering her because she feels is putting a strain on her hips.  Patient was encouraged to continue doing what is working for her and is doing focused on what she can control fix and  change.  Interventions: Cognitive Behavioral Therapy and Solution-Oriented/Positive Psychology  Diagnosis:   ICD-10-CM   1. Bipolar disorder, in full remission, most recent episode depressed (HCC)  F31.76       Plan: Patient is to use CBT and coping skills to decrease mood issues.  Patient is to working on limit setting with her family situation.  Patient is to continue working with her providers on her physical issues.  Is to work with a nutritionist. patient is to continue exercising to release negative emotions appropriately.  Patient is to focus on the things that she can do rather than what she cannot.  Patient is to continue taking medication as directed.  Stevphen Meuse, [facility]

## 2022-04-23 ENCOUNTER — Telehealth: Payer: Self-pay | Admitting: Psychiatry

## 2022-04-23 DIAGNOSIS — Z79899 Other long term (current) drug therapy: Secondary | ICD-10-CM

## 2022-04-23 NOTE — Telephone Encounter (Signed)
Lithium level order sent to [facility] 

## 2022-05-10 LAB — LITHIUM LEVEL: Lithium Lvl: 1 mmol/L (ref 0.6–1.2)

## 2022-05-27 ENCOUNTER — Ambulatory Visit: Payer: BC Managed Care – PPO | Admitting: Psychiatry

## 2022-07-01 IMAGING — DX DG HIP (WITH OR WITHOUT PELVIS) 2-3V*R*
3 series · 3 of 3 positions shown · non-contrast
Comparison: None.

CLINICAL DATA: Stress fracture of pelvis, initial encounter. Hip
pain. Patient reports pain in area of iliac crest.

EXAM:
DG HIP (WITH OR WITHOUT PELVIS) 2-3V RIGHT

[pelvis ap]
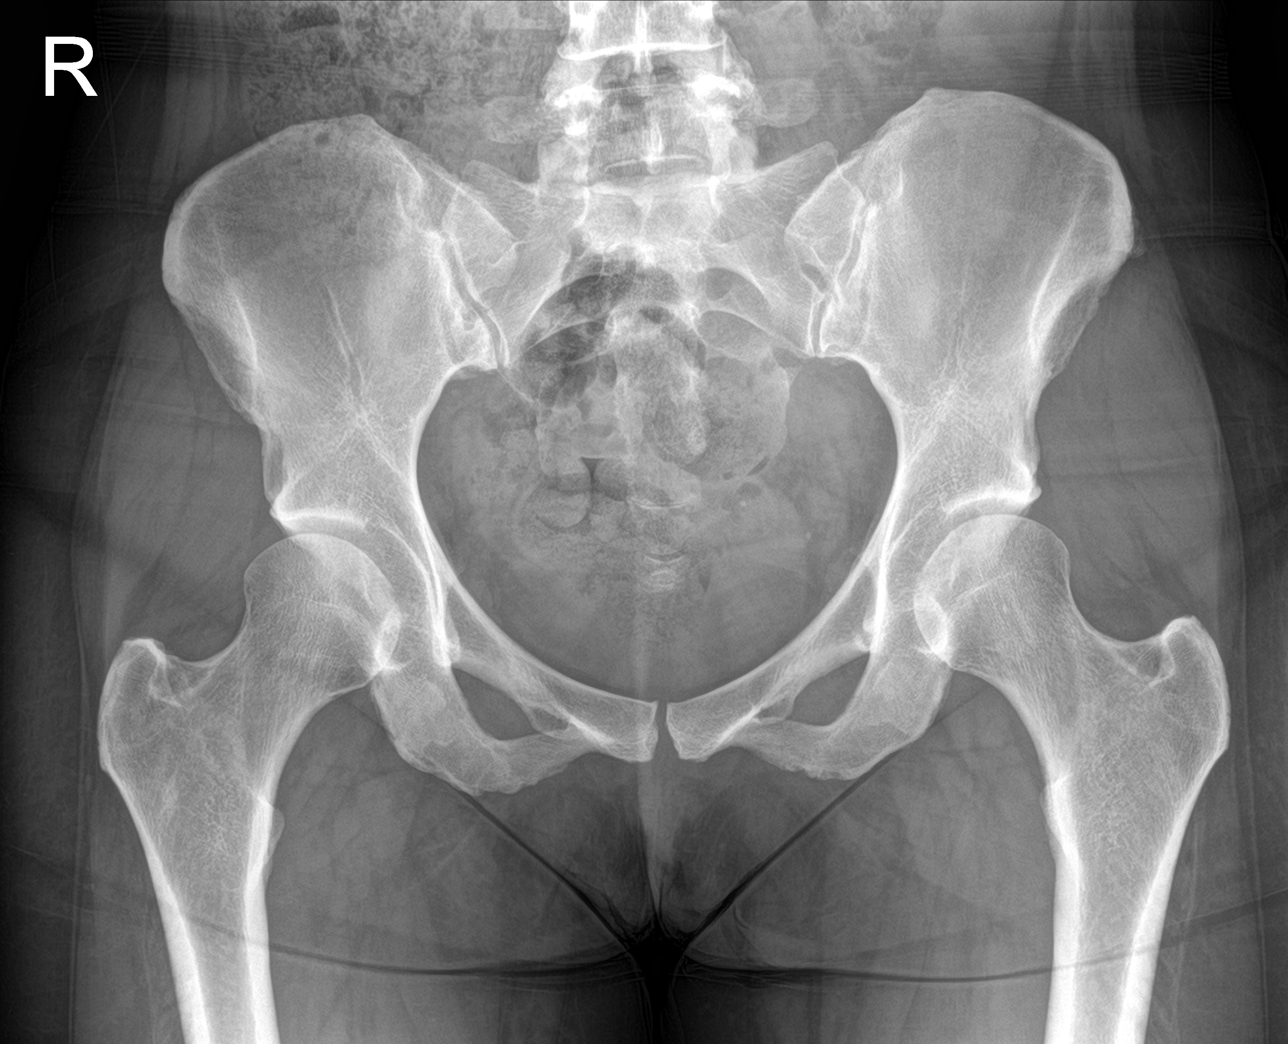

[hip ap]
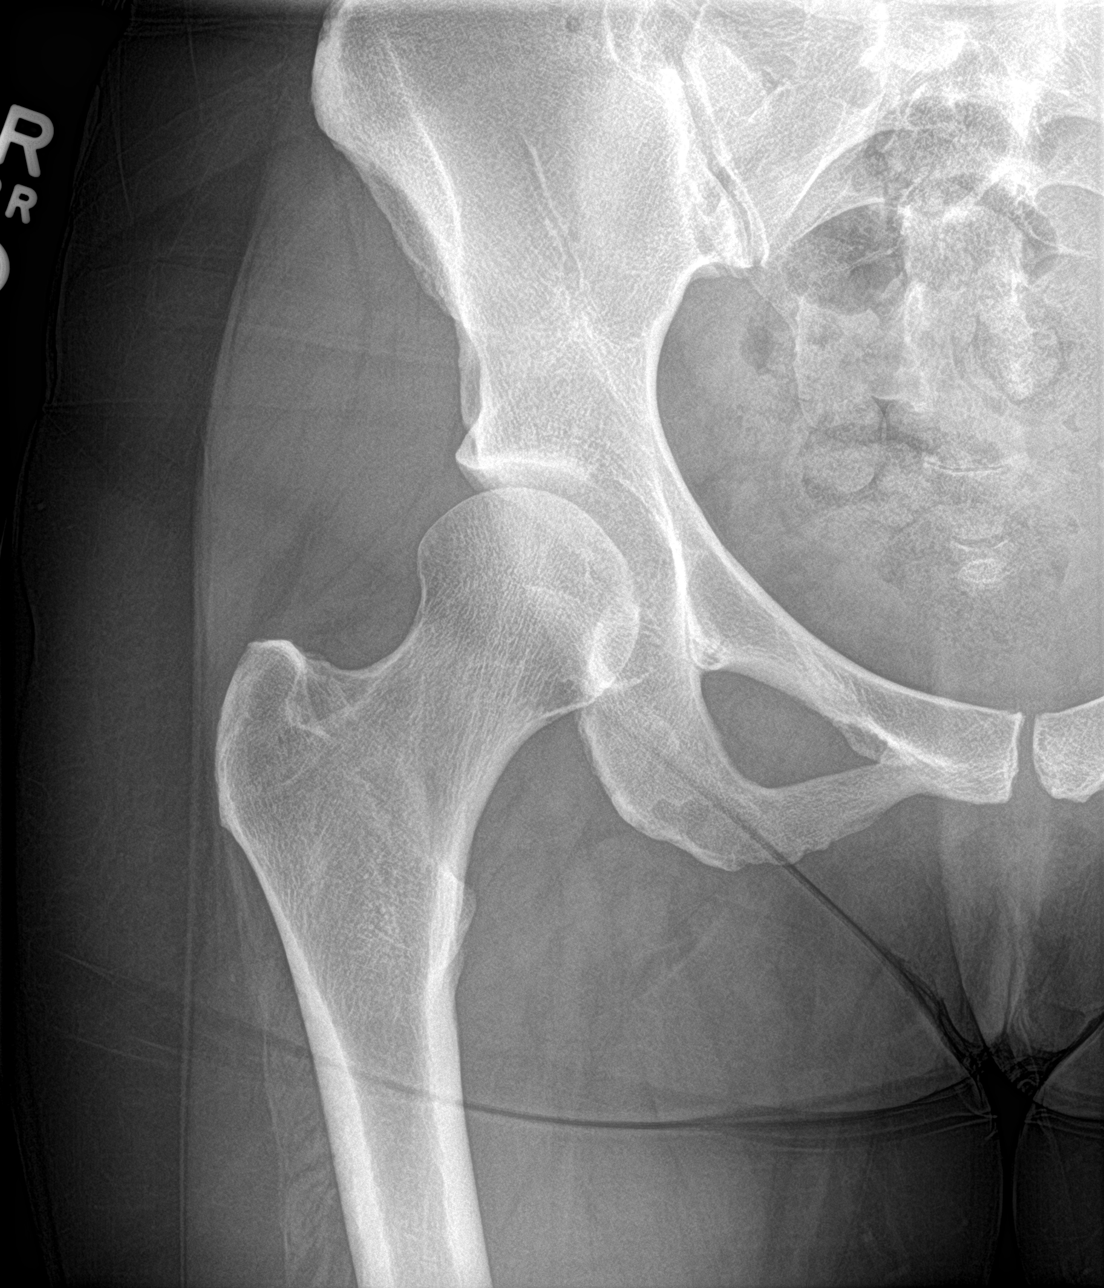

[hip lat]
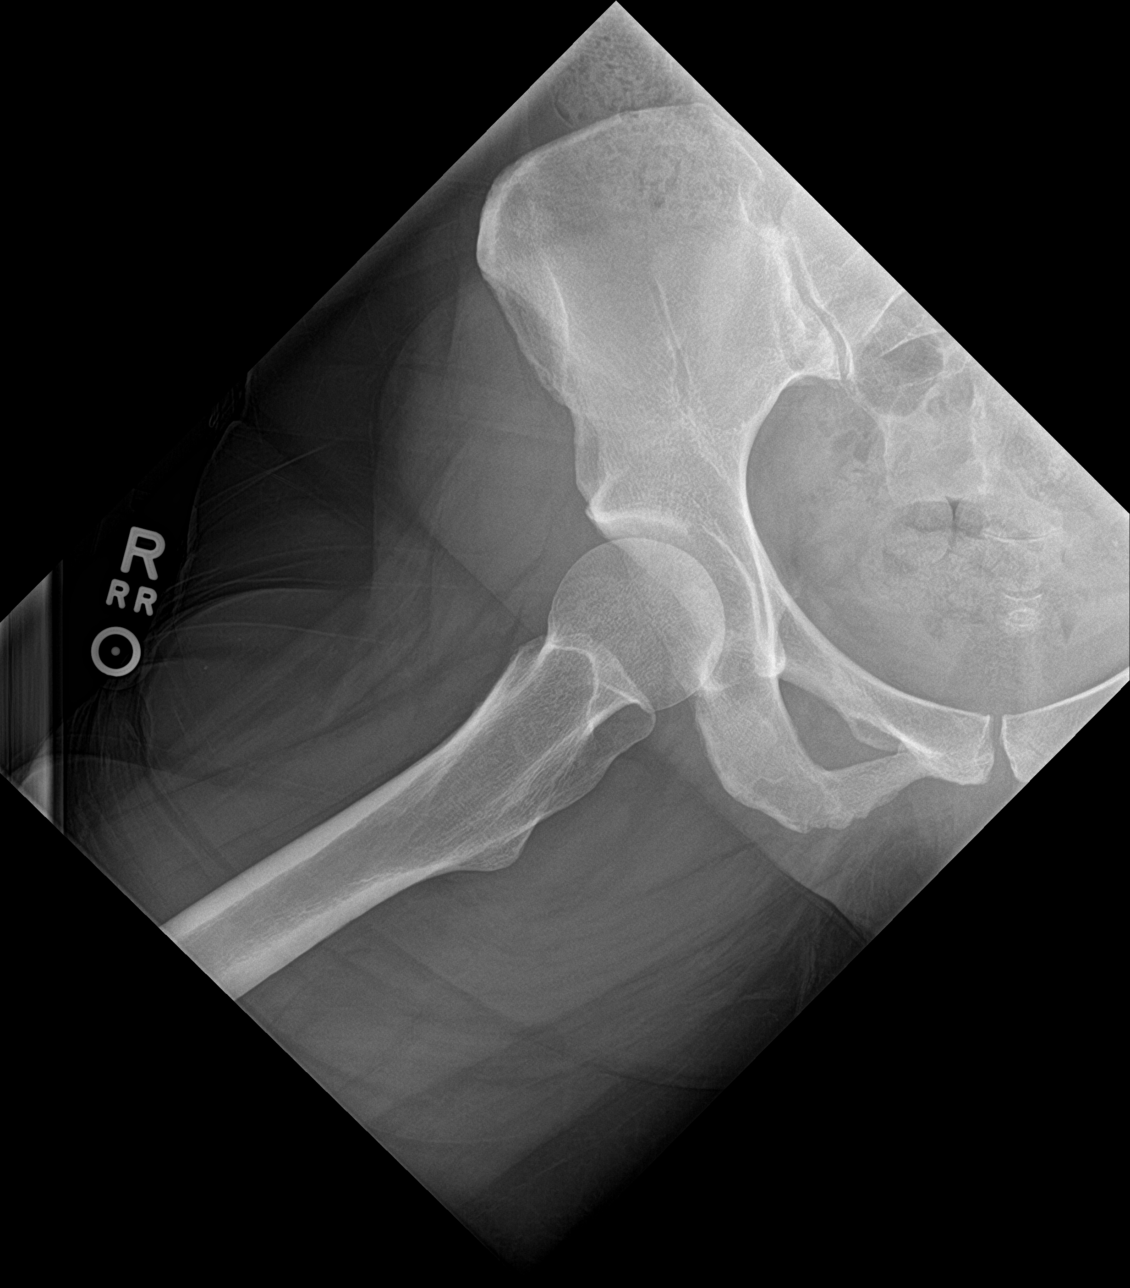

[3 of 3 positions shown; findings below may reference images not displayed]

FINDINGS: The right hip joint space is preserved. Slight shallow acetabulum.
No osteophytes or erosion. No fracture or periosteal reaction.
Intact pubic rami. No evidence of focal bone lesion or bone
destruction. Unremarkable soft tissues.
IMPRESSION: 1. No evidence of fracture stress reaction.
2. Slightly shallow acetabula.

## 2022-07-03 ENCOUNTER — Ambulatory Visit (INDEPENDENT_AMBULATORY_CARE_PROVIDER_SITE_OTHER): Payer: BC Managed Care – PPO | Admitting: Psychiatry

## 2022-07-03 DIAGNOSIS — F3176 Bipolar disorder, in full remission, most recent episode depressed: Secondary | ICD-10-CM | POA: Diagnosis not present

## 2022-07-03 NOTE — Progress Notes (Signed)
      [facility] Counselor/Therapist Progress Note  Patient ID: Isabel Benson, MRN: [id number],    Date: 07/03/2022  Time Spent: 50 minutes start time 12:07 PM end time 12:57 PM  Treatment Type: Individual Therapy  Reported Symptoms: anxiety, triggered responses  Mental Status Exam:  Appearance:   Well Groomed     Behavior:  Appropriate  Motor:  Normal  Speech/Language:   Normal Rate  Affect:  Appropriate  Mood:  normal  Thought process:  normal  Thought content:    WNL  Sensory/Perceptual disturbances:    WNL  Orientation:  oriented to person, place, time/date, and situation  Attention:  Good  Concentration:  Good  Memory:  WNL  Fund of knowledge:   Good  Insight:    Good  Judgment:   Good  Impulse Control:  Good   Risk Assessment: Danger to Self:  No Self-injurious Behavior: No Danger to Others: No Duty to Warn:no Physical Aggression / Violence:No  Access to Firearms a concern: No  Gang Involvement:No   Subjective: Patient was present for session.  She shared that she had a scare with her health but it seems to be okay currently.  She stated that her neighbors that they are very close to have moved.  She stated that they used to see each other everyday and now it is just twice a week.  Family drama is still what it is but her husband is handling it.  Patient stated that overall things are still going well.  She did report that her husband is having some stress with his work situation and has been pushing her to try and find another job to make more money.  Discussed how currently she is in a good situation and that when she was in a job making more money her mental health deteriorated so for her it is best to stay where she is.  Patient did share that she was able to let her husband know he needs to focus on his stuff.  Encouraged her to continue setting limits with him and encouraging him to look into other options for employment.  Interventions:  Solution-Oriented/Positive Psychology and Insight-Oriented  Diagnosis:   ICD-10-CM   1. Bipolar disorder, in full remission, most recent episode depressed ([place])  F31.76       Plan: Patient is to use CBT and coping skills to decrease mood issues.  Patient is to working on limit setting with her family situation.  Patient is to continue working with her providers on her physical issues.  Is to work with a nutritionist. patient is to continue exercising to release negative emotions appropriately.  Patient is to focus on the things that she can do rather than what she cannot.  Patient is to continue taking medication as directed.  Lina Sayre, [facility]

## 2022-07-09 ENCOUNTER — Telehealth: Payer: Self-pay | Admitting: Psychiatry

## 2022-07-09 NOTE — Telephone Encounter (Signed)
Lvm with info. 

## 2022-07-09 NOTE — Telephone Encounter (Signed)
Please let her know those medications should be fine to combine with Lithium. Please tell her I hope she feels better soon.

## 2022-07-09 NOTE — Telephone Encounter (Signed)
Pt called reporting she started taking 3 Rx meds from a teledoc appt Sunday. Flonase, Ipratropium Bromide nasal spray and Benzonatate cough pills. Just reporting to see if these meds will be safe with meds she takes for mental health. Contact Pt at [phone]. Apt 10/30

## 2022-07-09 NOTE — Telephone Encounter (Signed)
Please advise 

## 2022-07-21 ENCOUNTER — Encounter: Payer: Self-pay | Admitting: Psychiatry

## 2022-07-21 ENCOUNTER — Ambulatory Visit (INDEPENDENT_AMBULATORY_CARE_PROVIDER_SITE_OTHER): Payer: BC Managed Care – PPO | Admitting: Psychiatry

## 2022-07-21 DIAGNOSIS — F5105 Insomnia due to other mental disorder: Secondary | ICD-10-CM

## 2022-07-21 DIAGNOSIS — F99 Mental disorder, not otherwise specified: Secondary | ICD-10-CM

## 2022-07-21 DIAGNOSIS — F3176 Bipolar disorder, in full remission, most recent episode depressed: Secondary | ICD-10-CM | POA: Diagnosis not present

## 2022-07-21 MED ORDER — LITHIUM CARBONATE ER 300 MG PO TBCR: 600.0000 mg | EXTENDED_RELEASE_TABLET | Freq: Every day | ORAL | 1 refills | Status: DC

## 2022-07-21 MED ORDER — ZOLPIDEM TARTRATE 10 MG PO TABS: 5.0000 mg | ORAL_TABLET | Freq: Every evening | ORAL | 0 refills | Status: DC | PRN

## 2022-07-21 MED ORDER — LORAZEPAM 1 MG PO TABS: 1.0000 mg | ORAL_TABLET | Freq: Every day | ORAL | 0 refills | Status: DC | PRN

## 2022-07-21 MED ORDER — OMEGA-3-ACID ETHYL ESTERS 1 G PO CAPS: 1.0000 g | ORAL_CAPSULE | Freq: Every day | ORAL | 3 refills | Status: DC

## 2022-07-21 MED ORDER — L-METHYLFOLATE 15 MG PO TABS: 15.0000 mg | ORAL_TABLET | Freq: Every day | ORAL | 3 refills | Status: DC

## 2022-07-21 NOTE — Progress Notes (Signed)
Isabel Benson [id number] [birth year] 51 y.o.  Subjective:   Patient ID:  Isabel Benson is a 51 y.o. (DOB 11-23-1970) female.  Chief Complaint:  Chief Complaint  Patient presents with  . Follow-up    Bipolar Disorder    HPI Isabel Benson presents to the office today for follow-up of Bipolar Disorder.   She reports that her BUN was elevated in May and BUN and Creatinine were elevated in September (1.99). Creatinine was 1.95 on 02/21/22. She reports that she prefers not to reduce Lithium unless medically necessary.   She reports that her mood has been stable. Denies depressed mood. Denies manic s/s to include impulsivity or risky behavior. Energy and motivation have been ok. Concentration has been adequate and able to sustain focus. Appetite has been good. Sleeping well overall. Denies SI.  She fell while running last weekend and has sustained some minor injuries. She remains physically active and exercises regularly. She has been working with a nutritionist who recommended strength training. She has started doing some strength training. She has been increasing protein intake (80-100 grams) around running days. She reports gaining 4 lbs. She plans to start working with a sports trainer.    Denies any major psychosocial changes or stressors.   Review of Systems:  Review of Systems  Musculoskeletal:  Negative for gait problem.       Reports injury to pelvis while running  Skin:        Laceration on her hand  Neurological:  Negative for tremors.  Psychiatric/Behavioral:         Please refer to HPI    Medications: I have reviewed the patient's current medications.  Current Outpatient Medications  Medication Sig Dispense Refill  . Calcium Carbonate (CALCIUM 500 PO) Take by mouth.    . cholecalciferol (VITAMIN D3) 25 MCG (1000 UNIT) tablet Take 2,000 Units by mouth daily.    Marland Kitchen glucosamine-chondroitin 500-400 MG tablet Take 1 tablet by mouth 3 (three) times daily.    Marland Kitchen lithium  carbonate (LITHOBID) 300 MG CR tablet Take 2 tablets (600 mg total) by mouth at bedtime. 180 tablet 1  . omega-3 acid ethyl esters (LOVAZA) 1 g capsule Take 1 capsule (1 g total) by mouth daily. 90 capsule 3  . L-Methylfolate 15 MG TABS Take 1 tablet (15 mg total) by mouth daily. 90 tablet 3  . LORazepam (ATIVAN) 1 MG tablet Take 1 tablet (1 mg total) by mouth daily as needed for anxiety. (Patient not taking: Reported on 07/21/2022) 30 tablet 0  . valACYclovir (VALTREX) 500 MG tablet 1 tab po twice a day at onset of symptoms    . zolpidem (AMBIEN) 10 MG tablet Take 0.5 tablets (5 mg total) by mouth at bedtime as needed for sleep. 30 tablet 0   No current facility-administered medications for this visit.    Medication Side Effects: Other: Increased creatinine.   Allergies:  Allergies  Allergen Reactions  . Sudafed [Pseudoephedrine Hcl]     Past Medical History:  Diagnosis Date  . Chronic renal failure, stage 3 (moderate) (HCC)   . Multiple renal cysts     Past Medical History, Surgical history, Social history, and Family history were reviewed and updated as appropriate.   Please see review of systems for further details on the patient's review from today.   Objective:   Physical Exam:  Wt 144 lb (65.3 kg)   BMI 24.72 kg/m   Physical Exam  Lab Review:     Component Value Date/Time  NA 137 08/25/2018 0833   K 4.4 08/25/2018 0833   CL 105 08/25/2018 0833   CO2 27 08/25/2018 0833   GLUCOSE 85 08/25/2018 0833   BUN 22 08/25/2018 0833   CREATININE 1.58 (H) 08/25/2018 0833   CALCIUM 9.6 08/25/2018 0833   GFRNONAA 39 (L) 08/25/2018 0833   GFRAA 45 (L) 08/25/2018 0833    No results found for: "WBC", "RBC", "HGB", "HCT", "PLT", "MCV", "MCH", "MCHC", "RDW", "LYMPHSABS", "MONOABS", "EOSABS", "BASOSABS"  Lithium Lvl  Date Value Ref Range Status  05/09/2022 1.0 0.6 - 1.2 mmol/L Final     No results found for: "PHENYTOIN", "PHENOBARB", "VALPROATE", "CBMZ"    .res Assessment: Plan:    There are no diagnoses linked to this encounter.   Please see After Visit Summary for patient specific instructions.  Future Appointments  Date Time Provider [place]  10/06/2022 12:00 PM Lina Sayre, [facility] CP-CP None    No orders of the defined types were placed in this encounter.   -------------------------------

## 2022-10-06 ENCOUNTER — Ambulatory Visit (INDEPENDENT_AMBULATORY_CARE_PROVIDER_SITE_OTHER): Payer: BC Managed Care – PPO | Admitting: Psychiatry

## 2022-10-06 ENCOUNTER — Ambulatory Visit (INDEPENDENT_AMBULATORY_CARE_PROVIDER_SITE_OTHER): Payer: BC Managed Care – PPO | Admitting: Family Medicine

## 2022-10-06 ENCOUNTER — Ambulatory Visit: Payer: Self-pay

## 2022-10-06 VITALS — BP 104/70 | Ht 64.0 in | Wt 146.0 lb

## 2022-10-06 DIAGNOSIS — F3176 Bipolar disorder, in full remission, most recent episode depressed: Secondary | ICD-10-CM | POA: Diagnosis not present

## 2022-10-06 DIAGNOSIS — S8001XA Contusion of right knee, initial encounter: Secondary | ICD-10-CM | POA: Diagnosis not present

## 2022-10-06 DIAGNOSIS — M25561 Pain in right knee: Secondary | ICD-10-CM

## 2022-10-06 NOTE — Progress Notes (Signed)
      [facility] Counselor/Therapist Progress Note  Patient ID: Isabel Benson, MRN: [id number],    Date: 10/06/2022  Time Spent: 50 minutes start time 12:08 PM end 12:58 PM  Treatment Type: Individual Therapy  Reported Symptoms: sadness, anxiety, triggered responses  Mental Status Exam:  Appearance:   Well Groomed     Behavior:  Appropriate  Motor:  Normal  Speech/Language:   Normal Rate  Affect:  Appropriate  Mood:  anxious  Thought process:  normal  Thought content:    WNL  Sensory/Perceptual disturbances:    WNL  Orientation:  oriented to person, place, time/date, and situation  Attention:  Good  Concentration:  Good  Memory:  WNL  Fund of knowledge:   Good  Insight:    Good  Judgment:   Good  Impulse Control:  Good   Risk Assessment: Danger to Self:  No Self-injurious Behavior: No Danger to Others: No Duty to Warn:no Physical Aggression / Violence:No  Access to Firearms a concern: No  Gang Involvement:No   Subjective: Patient was present for session.She shared that she and her sister went of hot chocolate and she found out that her nephew feels like her husband doesn't like him. There were incidents that occurred several years ago.  She shared that there different dynamics which have lead to this situation.  Discussed ways for her to handle it appropriately with her nephew and she was given a handout to help guide her through the processing with him.  Patient also shared that her stepfather is declining more and there is concerns about what may happen next with him once her mother cannot take care of him.  Patient stated she knows it is coming and she and her husband are working on a plan but at this time her mother is not ready to do anything so they are not.  Patient stated the situation at work seems to be getting worse with her one employee who struggles in completing his work appropriately.  She is trying to continue to document and prepare for what ever she has to  do.  Patient went on to share the biggest issue is she has had 2 falls while running and now she is unable to run more than 3 miles without having issues in her knee.  She is going to a doctor after session to get it looked at.  Patient was encouraged to take things 1 step at a time and to remind herself of her coping skills and that she is enough.  Interventions: Solution-Oriented/Positive Psychology and Insight-Oriented  Diagnosis:   ICD-10-CM   1. Bipolar disorder, in full remission, most recent episode depressed ([place])  F31.76       Plan: Patient is to use CBT and coping skills to decrease mood issues.  Patient is to working on limit setting with her family situation.  Patient is to continue working with her providers on her physical issues.  Is to work with a nutritionist. patient is to continue exercising to release negative emotions appropriately.  Patient is to focus on the things that she can do rather than what she cannot.  Patient is to continue taking medication as directed.   Lina Sayre, [facility]

## 2022-10-06 NOTE — Assessment & Plan Note (Signed)
Acutely occurring.  Second fall was December 15.  Ultrasound exam of her knee is reassuring and findings more consistent with a bony origin. -Counseled on home exercise therapy and supportive care. -Counseled on vitamin D3 and vitamin K2. -Counseled on compression. -Could consider further imaging.

## 2022-10-06 NOTE — Patient Instructions (Signed)
Good to see you Please try vitamin K2 and D3  You can consider the knee compression sleeve  Please continue the exercises   Please send me a message in MyChart with any questions or updates.  Please see me back in 6 weeks.   --Dr. Raeford Razor

## 2022-10-06 NOTE — Progress Notes (Signed)
  Isabel Benson - 52 y.o. female MRN [id number]  Date of birth: Nov 12, 1970  SUBJECTIVE:  Including CC & ROS.  No chief complaint on file.   Isabel Benson is a 52 y.o. female that is presenting with right knee pain.  This is acutely occurring.  She has had 2 different falls with the last one being Raeford Razor on the 15th.  Initial fall was in October.  Now she is having pain over the lateral portion of the knee.  Having trouble running when she gets to 3 or 4 miles into a run.    Review of Systems See HPI   HISTORY: Past Medical, Surgical, Social, and Family History Reviewed & Updated per EMR.   Pertinent Historical Findings include:  Past Medical History:  Diagnosis Date   Chronic renal failure, stage 3 (moderate) (HCC)    Multiple renal cysts     No past surgical history on file.   PHYSICAL EXAM:  VS: BP 104/70   Ht 5\' 4"  (1.626 m)   Wt 146 lb (66.2 kg)   BMI 25.06 kg/m  Physical Exam Gen: NAD, alert, cooperative with exam, well-appearing MSK:  Neurovascularly intact    Limited ultrasound: Right knee pain:  No effusion suprapatellar pouch. Normal-appearing quadricep and patellar tendon. No changes of the medial or lateral joint space. There is increased hyperemia at the anterior lateral proximal tibial plateau.  Summary: Findings consistent with a bony contusion of the lateral proximal tibia.  Ultrasound and interpretation by Clearance Coots, MD    ASSESSMENT & PLAN:   Contusion of right knee Acutely occurring.  Second fall was December 15.  Ultrasound exam of her knee is reassuring and findings more consistent with a bony origin. -Counseled on home exercise therapy and supportive care. -Counseled on vitamin D3 and vitamin K2. -Counseled on compression. -Could consider further imaging.

## 2023-01-05 ENCOUNTER — Encounter: Payer: Self-pay | Admitting: *Deleted

## 2023-01-15 ENCOUNTER — Encounter: Payer: Self-pay | Admitting: Psychiatry

## 2023-01-15 ENCOUNTER — Ambulatory Visit (INDEPENDENT_AMBULATORY_CARE_PROVIDER_SITE_OTHER): Payer: BC Managed Care – PPO | Admitting: Psychiatry

## 2023-01-15 DIAGNOSIS — F5105 Insomnia due to other mental disorder: Secondary | ICD-10-CM | POA: Diagnosis not present

## 2023-01-15 DIAGNOSIS — Z79899 Other long term (current) drug therapy: Secondary | ICD-10-CM

## 2023-01-15 DIAGNOSIS — F99 Mental disorder, not otherwise specified: Secondary | ICD-10-CM

## 2023-01-15 DIAGNOSIS — F3176 Bipolar disorder, in full remission, most recent episode depressed: Secondary | ICD-10-CM | POA: Diagnosis not present

## 2023-01-15 MED ORDER — ZOLPIDEM TARTRATE 10 MG PO TABS: 5.0000 mg | ORAL_TABLET | Freq: Every evening | ORAL | 0 refills | Status: DC | PRN

## 2023-01-15 MED ORDER — LITHIUM CARBONATE ER 300 MG PO TBCR: 600.0000 mg | EXTENDED_RELEASE_TABLET | Freq: Every day | ORAL | 1 refills | Status: DC

## 2023-01-15 MED ORDER — LORAZEPAM 1 MG PO TABS: 1.0000 mg | ORAL_TABLET | Freq: Every day | ORAL | 0 refills | Status: DC | PRN

## 2023-01-15 MED ORDER — OMEGA-3-ACID ETHYL ESTERS 1 G PO CAPS: 1.0000 g | ORAL_CAPSULE | Freq: Every day | ORAL | 3 refills | Status: DC

## 2023-01-15 MED ORDER — L-METHYLFOLATE 15 MG PO TABS: 15.0000 mg | ORAL_TABLET | Freq: Every day | ORAL | 3 refills | Status: DC

## 2023-01-15 NOTE — Progress Notes (Signed)
Isabel Benson [id number] 1970/11/12 52 y.o.  Subjective:   Patient ID:  Isabel Benson is a 52 y.o. (DOB 10-24-1970) female.  Chief Complaint:  Chief Complaint  Patient presents with   Follow-up    Bipolar disorder and anxiety    HPI Isabel Benson presents to the office today for follow-up of bipolar disorder and anxiety. Mood has been stable. Denies depressed mood. Denies anxiety. Energy and motivation were ok. Physical activity has been limited due to knee issue. She is working with a nutritionist around diet plan for activity and maintaining a healthy weight. She reports that she has been able to slowly lose weight. Sleeping well. Concentration has been good. Denies SI.   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 Work has been going ok.   Father has dementia and mother is his caregiver.  Her in-laws are in their 58's.   Past treatment failures with multiple medications to include Depakote and Carbamazepine.   Reports using Lorazepam x1 around Easter to help with sleep.  Review of Systems:  Review of Systems  Musculoskeletal:  Negative for gait problem.       Has recently started running after not being able to run for 8 weeks after injury.   Neurological:  Negative for tremors.  Psychiatric/Behavioral:         Please refer to HPI    She has a new PCP. Sees Nephrologist in June.   Medications: I have reviewed the patient's current medications.  Current Outpatient Medications  Medication Sig Dispense Refill   Calcium Carbonate (CALCIUM 500 PO) Take by mouth.     cholecalciferol (VITAMIN D3) 25 MCG (1000 UNIT) tablet Take 2,000 Units by mouth daily.     glucosamine-chondroitin 500-400 MG tablet Take 1 tablet by mouth 3 (three) times daily.     Multiple Vitamin (MULTIVITAMIN) capsule Take 1 capsule by mouth daily.     valACYclovir (VALTREX) 500 MG tablet 1 tab po twice a day at onset of symptoms     L-Methylfolate 15 MG TABS Take 1 tablet (15 mg  total) by mouth daily. 90 tablet 3   lithium carbonate (LITHOBID) 300 MG ER tablet Take 2 tablets (600 mg total) by mouth at bedtime. 180 tablet 1   LORazepam (ATIVAN) 1 MG tablet Take 1 tablet (1 mg total) by mouth daily as needed for anxiety. 15 tablet 0   omega-3 acid ethyl esters (LOVAZA) 1 g capsule Take 1 capsule (1 g total) by mouth daily. 90 capsule 3   zolpidem (AMBIEN) 10 MG tablet Take 0.5 tablets (5 mg total) by mouth at bedtime as needed for sleep. 15 tablet 0   No current facility-administered medications for this visit.    Medication Side Effects: Other: Kidney function  Allergies:  Allergies  Allergen Reactions   Sudafed [Pseudoephedrine Hcl]     Past Medical History:  Diagnosis Date   Chronic renal failure, stage 3 (moderate) (HCC)    Multiple renal cysts     Past Medical History, Surgical history, Social history, and Family history were reviewed and updated as appropriate.   Please see review of systems for further details on the patient's review from today.   Objective:   Physical Exam:  There were no vitals taken for this visit.  Physical Exam Constitutional:      General: She is not in acute distress. Musculoskeletal:        General: No deformity.  Neurological:     Mental Status: She  is alert and oriented to person, place, and time.     Coordination: Coordination normal.  Psychiatric:        Attention and Perception: Attention and perception normal. She does not perceive auditory or visual hallucinations.        Mood and Affect: Mood normal. Mood is not anxious or depressed. Affect is not labile, blunt, angry or inappropriate.        Speech: Speech normal.        Behavior: Behavior normal.        Thought Content: Thought content normal. Thought content is not paranoid or delusional. Thought content does not include homicidal or suicidal ideation. Thought content does not include homicidal or suicidal plan.        Cognition and Memory: Cognition and  memory normal.        Judgment: Judgment normal.     Comments: Insight intact     Lab Review:     Component Value Date/Time   NA 137 08/25/2018 0833   K 4.4 08/25/2018 0833   CL 105 08/25/2018 0833   CO2 27 08/25/2018 0833   GLUCOSE 85 08/25/2018 0833   BUN 22 08/25/2018 0833   CREATININE 1.58 (H) 08/25/2018 0833   CALCIUM 9.6 08/25/2018 0833   GFRNONAA 39 (L) 08/25/2018 0833   GFRAA 45 (L) 08/25/2018 0833    No results found for: "WBC", "RBC", "HGB", "HCT", "PLT", "MCV", "MCH", "MCHC", "RDW", "LYMPHSABS", "MONOABS", "EOSABS", "BASOSABS"  Lithium Lvl  Date Value Ref Range Status  05/09/2022 1.0 0.6 - 1.2 mmol/L Final     No results found for: "PHENYTOIN", "PHENOBARB", "VALPROATE", "CBMZ"   .res Assessment: Plan:   I spent 27 minutes dedicated to the care of this patient on the date of this  encounter to include pre-visit review of records, face-to-face time with the patient discussing lithium and kidney function, ordering of medication and labs, and post visit documentation. Reviewed that continued use of Lithium may worsen kidney function and pt continues to report that benefits of lithium outweigh adverse renal effects since she reports that multiple past medication trials were ineffective for mood stabilization in the past and she had severe manic symptoms. She reports that her mood is well controlled with Lithium and denies any mood symptoms. She reports that she has an upcoming apt with her nephrologist and plans to discuss his recommendations about if/when she would need to change treatment. Discussed that another medication for mood stabilization could be added and lithium dose could be reduced since lower dose of Lithium may have less negative effects on kidney function. Pt prefers to continue Lithium ER 600 mg at bedtime at this time.  Will order Lithium level and BMP. Continue L-methylfolate 15 mg po qd for mood.  Continue Lovaza for mood symptoms.  Continue Lorazepam  prn anxiety or insomnia. Continue Ambien 5 mg po QHS prn insomnia.  Pt to follow-up in 6 months or sooner if clinically indicated.  Patient advised to contact office with any questions, adverse effects, or acute worsening in signs and symptoms.   Trella was seen today for follow-up.  Diagnoses and all orders for this visit:  Bipolar disorder, in full remission, most recent episode depressed (HCC) -     lithium carbonate (LITHOBID) 300 MG ER tablet; Take 2 tablets (600 mg total) by mouth at bedtime. -     L-Methylfolate 15 MG TABS; Take 1 tablet (15 mg total) by mouth daily. -     omega-3 acid ethyl esters (  LOVAZA) 1 g capsule; Take 1 capsule (1 g total) by mouth daily.  High risk medication use -     Lithium level -     Basic metabolic panel  Insomnia due to other mental disorder -     zolpidem (AMBIEN) 10 MG tablet; Take 0.5 tablets (5 mg total) by mouth at bedtime as needed for sleep. -     LORazepam (ATIVAN) 1 MG tablet; Take 1 tablet (1 mg total) by mouth daily as needed for anxiety.     Please see After Visit Summary for patient specific instructions.  Future Appointments  Date Time Provider Department Center  02/19/2023  9:00 AM Stevphen Meuse, [facility] CP-CP None  04/30/2023  8:30 AM Corie Chiquito, [facility] CP-CP None    Orders Placed This Encounter  Procedures   Lithium level   Basic metabolic panel    -------------------------------

## 2023-01-19 ENCOUNTER — Ambulatory Visit: Payer: BC Managed Care – PPO | Admitting: Psychiatry

## 2023-02-19 ENCOUNTER — Ambulatory Visit (INDEPENDENT_AMBULATORY_CARE_PROVIDER_SITE_OTHER): Payer: BC Managed Care – PPO | Admitting: Psychiatry

## 2023-02-19 DIAGNOSIS — F3176 Bipolar disorder, in full remission, most recent episode depressed: Secondary | ICD-10-CM

## 2023-02-19 NOTE — Progress Notes (Signed)
      [facility] Counselor/Therapist Progress Note  Patient ID: Isabel Benson, MRN: [id number],    Date: 02/19/2023  Time Spent: 50 minutes start time 9:09 AM end time 9:59 AM  Treatment Type: Individual Therapy  Reported Symptoms: anxiety, triggered responses  Mental Status Exam:  Appearance:   Well Groomed     Behavior:  Appropriate  Motor:  Normal  Speech/Language:   Normal Rate  Affect:  Appropriate  Mood:  anxious  Thought process:  normal  Thought content:    WNL  Sensory/Perceptual disturbances:    WNL  Orientation:  oriented to person, place, time/date, and situation  Attention:  Good  Concentration:  Good  Memory:  WNL  Fund of knowledge:   Good  Insight:    Good  Judgment:   Good  Impulse Control:  Good   Risk Assessment: Danger to Self:  No Self-injurious Behavior: No Danger to Others: No Duty to Warn:no Physical Aggression / Violence:No  Access to Firearms a concern: No  Gang Involvement:No   Subjective: Patient was present for session. She shared she is struggling with her work. She shared she loves it there but her pay is not what she needs it to be. She is also having bumps with dynamics at work due to her boss is not typically at the office which puts more on her. Her one employee is still difficult, but she is realizing she has done what she needs to for him. She shared her husband is wanting her to figure something else out due to his situation.  Her mother is still having to make sure that her stepfather is not left alone and that has been hard for her. Discussed reminding herself she has to let her mom do things her way and deal with the results. Patient shared that her weight issues were concerning her even though she working with a nutritionist.  Encouraged her to recognize that she is getting older and she has to maintain perspective and do what she needs to be healthy.  Interventions: Solution-Oriented/Positive Psychology  Diagnosis:   ICD-10-CM    1. Bipolar disorder, in full remission, most recent episode depressed (HCC)  F31.76       Plan: Patient is to use CBT and coping skills to decrease mood issues.  Patient is to working on limit setting with her family situation.  Patient is to continue working with her providers on her physical issues.  Is to work with a nutritionist. patient is to continue exercising to release negative emotions appropriately.  Patient is to focus on the things that she can do rather than what she cannot.  Patient is to continue taking medication as directed.   Stevphen Meuse, [facility]

## 2023-03-09 ENCOUNTER — Encounter: Payer: Self-pay | Admitting: Psychiatry

## 2023-03-20 ENCOUNTER — Encounter: Payer: Self-pay | Admitting: Psychiatry

## 2023-03-20 ENCOUNTER — Ambulatory Visit (INDEPENDENT_AMBULATORY_CARE_PROVIDER_SITE_OTHER): Payer: BC Managed Care – PPO | Admitting: Psychiatry

## 2023-03-20 DIAGNOSIS — F3176 Bipolar disorder, in full remission, most recent episode depressed: Secondary | ICD-10-CM | POA: Diagnosis not present

## 2023-03-20 MED ORDER — LURASIDONE HCL 40 MG PO TABS
ORAL_TABLET | ORAL | 1 refills | Status: DC
Start: 2023-03-20 — End: 2023-03-23

## 2023-03-20 NOTE — Progress Notes (Signed)
[name] [id number] 04-07-1971 52 y.o.  Subjective:   Patient ID:  Isabel Benson is a 52 y.o. (DOB 01-Aug-1971) female.  Chief Complaint:  Chief Complaint  Patient presents with   Medication Problem    Worsening kidney function associated with long-term lithium use   Follow-up    Bipolar Disorder    HPI Isabel Benson presents to the office today earlier than scheduled due to increased Creatinine and BUN levels. She recently saw nephrologist, Dr. Cammy Copa, who attributes worsening renal function to chronic lithium use. Pt reports that she previously was wanting to continue Lithium despite renal risks and now is interested in possible alternatives to lithium or reducing lithium to prevent continued worsening in kidney function or need for dialysis. She is accompanied by her husband.    She and her husband report that in the past she has had predominantly manic episodes, and that most recent episode about 6 years ago was a depressive episode. She reports that she has had paranoia in the past with manic episodes.   Mood has been stable. Denies anxiety. She reports that her job stress is low. Sleeping well. Appetite has been good. Energy and motivation have been good. Adequate concentration. Denies SI.   Has not needed Ambien prn or Lorazepam prn.   Past Psychiatric Medication Trials: Depakote carbamazepine Risperdal  Seroquel  Review of Systems:  Review of Systems  Musculoskeletal:  Negative for gait problem.  Neurological:  Negative for tremors.  Psychiatric/Behavioral:         Please refer to HPI    Medications: I have reviewed the patient's current medications.  Current Outpatient Medications  Medication Sig Dispense Refill   glucosamine-chondroitin 500-400 MG tablet Take 1 tablet by mouth 3 (three) times daily.     L-Methylfolate 15 MG TABS Take 1 tablet (15 mg total) by mouth daily. 90 tablet 3   lithium carbonate (LITHOBID) 300 MG ER tablet Take 2 tablets (600  mg total) by mouth at bedtime. 180 tablet 1   lurasidone (LATUDA) 40 MG TABS tablet Take 1/2 tablet daily with supper for one week, then increase to 1 tablet daily 30 tablet 1   omega-3 acid ethyl esters (LOVAZA) 1 g capsule Take 1 capsule (1 g total) by mouth daily. 90 capsule 3   LORazepam (ATIVAN) 1 MG tablet Take 1 tablet (1 mg total) by mouth daily as needed for anxiety. 15 tablet 0   valACYclovir (VALTREX) 500 MG tablet 1 tab po twice a day at onset of symptoms     zolpidem (AMBIEN) 10 MG tablet Take 0.5 tablets (5 mg total) by mouth at bedtime as needed for sleep. 15 tablet 0   No current facility-administered medications for this visit.    Medication Side Effects: Other: Impaired renal function  Allergies:  Allergies  Allergen Reactions   Sudafed [Pseudoephedrine Hcl]     Past Medical History:  Diagnosis Date   Chronic renal failure, stage 3 (moderate) (HCC)    Multiple renal cysts     Past Medical History, Surgical history, Social history, and Family history were reviewed and updated as appropriate.   Please see review of systems for further details on the patient's review from today.   Objective:   Physical Exam:  There were no vitals taken for this visit.  Physical Exam Constitutional:      General: She is not in acute distress. Musculoskeletal:        General: No deformity.  Neurological:     Mental Status:  She is alert and oriented to person, place, and time.     Coordination: Coordination normal.  Psychiatric:        Attention and Perception: Attention and perception normal. She does not perceive auditory or visual hallucinations.        Mood and Affect: Mood normal. Mood is not anxious or depressed. Affect is not labile, blunt, angry or inappropriate.        Speech: Speech normal.        Behavior: Behavior normal.        Thought Content: Thought content normal. Thought content is not paranoid or delusional. Thought content does not include homicidal or  suicidal ideation. Thought content does not include homicidal or suicidal plan.        Cognition and Memory: Cognition and memory normal.        Judgment: Judgment normal.     Comments: Insight intact     Lab Review:     Component Value Date/Time   NA 137 08/25/2018 0833   K 4.4 08/25/2018 0833   CL 105 08/25/2018 0833   CO2 27 08/25/2018 0833   GLUCOSE 85 08/25/2018 0833   BUN 22 08/25/2018 0833   CREATININE 1.58 (H) 08/25/2018 0833   CALCIUM 9.6 08/25/2018 0833   GFRNONAA 39 (L) 08/25/2018 0833   GFRAA 45 (L) 08/25/2018 0833    No results found for: "WBC", "RBC", "HGB", "HCT", "PLT", "MCV", "MCH", "MCHC", "RDW", "LYMPHSABS", "MONOABS", "EOSABS", "BASOSABS"  Lithium Lvl  Date Value Ref Range Status  05/09/2022 1.0 0.6 - 1.2 mmol/L Final     No results found for: "PHENYTOIN", "PHENOBARB", "VALPROATE", "CBMZ"   .res Assessment: Plan:    I spent 45 minutes dedicated to the care of this patient on the date of this  encounter to include pre-visit review of record to include visit with nephrologist and most recent lab results, face-to-face time with the patient and her husband discussing alternatives to lithium for management of bipolar disorder, discussing plan to start an alternative first to determine tolerability before reducing lithium and then reducing lithium gradually, reviewing history of past mood episodes to determine appropriate treatment, ordering of medication, and post visit documentation. Discussed potential metabolic side effects associated with atypical antipsychotics, as well as potential risk for movement side effects. Advised pt to contact office if movement side effects occur.  Discussed potential benefits, risks, and side effects of Latuda. Will start Latuda 20 mg daily with supper for one week, then increase Latuda to 40 mg daily for mood stabilization.  Will continue Lithium ER 600 mg po at bedtime for bipolar disorder while determining tolerability of  Latuda. Will then consider dose reduction to 450 mg po at bedtime.  Continue L-methylfolate 15 mg po every day for mood symptoms.  Continue Lovaza for mood symptoms.  Pt has not needed Ambien prn or Lorazepam prn recently, however she has these medications available to start immediately if she develops insomnia and/or manic symptoms.  Pt to follow-up with this provider in 6 weeks or sooner if clinically indicated.  Recommend continuing therapy with Stevphen Meuse, [facility].  Patient advised to contact office with any questions, adverse effects, or acute worsening in signs and symptoms.   Isabel Benson was seen today for medication problem and follow-up.  Diagnoses and all orders for this visit:  Bipolar disorder, in full remission, most recent episode depressed (HCC) -     lurasidone (LATUDA) 40 MG TABS tablet; Take 1/2 tablet daily with supper for one week, then  increase to 1 tablet daily     Please see After Visit Summary for patient specific instructions.  Future Appointments  Date Time Provider Department Center  04/30/2023 10:15 AM Corie Chiquito, PMHNP CP-CP None  06/25/2023  9:00 AM Stevphen Meuse, [facility] None    No orders of the defined types were placed in this encounter.   -------------------------------

## 2023-03-23 ENCOUNTER — Telehealth: Payer: Self-pay | Admitting: Psychiatry

## 2023-03-23 DIAGNOSIS — F3176 Bipolar disorder, in full remission, most recent episode depressed: Secondary | ICD-10-CM

## 2023-03-23 MED ORDER — LURASIDONE HCL 40 MG PO TABS
ORAL_TABLET | ORAL | 1 refills | Status: DC
Start: 2023-03-23 — End: 2023-04-30

## 2023-03-23 NOTE — Telephone Encounter (Signed)
Isabel Benson called at 10:03 to request that her prescription for Latuda be switched to another pharmacy due to cost.  It is a lot less expensive at [facility] at [id number], [place] [id number].  Please send it there.

## 2023-03-23 NOTE — Telephone Encounter (Signed)
Sent!

## 2023-04-07 NOTE — Telephone Encounter (Signed)
Pt LVM @ 9:10a stating that the Latuda 40mg  is not giving her side effects like nausea.  It's giving her good energy but it seems she can't go back to sleep, so she had to take a Lorazepam.  She is just wonder if there's a more tweaking to do with the Latuda.  Nexzt appt 8/8

## 2023-04-30 ENCOUNTER — Ambulatory Visit: Payer: BC Managed Care – PPO | Admitting: Psychiatry

## 2023-04-30 ENCOUNTER — Encounter: Payer: Self-pay | Admitting: Psychiatry

## 2023-04-30 ENCOUNTER — Ambulatory Visit (INDEPENDENT_AMBULATORY_CARE_PROVIDER_SITE_OTHER): Payer: BC Managed Care – PPO | Admitting: Psychiatry

## 2023-04-30 DIAGNOSIS — F3176 Bipolar disorder, in full remission, most recent episode depressed: Secondary | ICD-10-CM | POA: Diagnosis not present

## 2023-04-30 MED ORDER — LITHIUM CARBONATE ER 450 MG PO TBCR: 450.0000 mg | EXTENDED_RELEASE_TABLET | Freq: Every day | ORAL | 1 refills | Status: DC

## 2023-04-30 MED ORDER — LURASIDONE HCL 40 MG PO TABS
40.0000 mg | ORAL_TABLET | Freq: Every day | ORAL | 0 refills | Status: DC
Start: 2023-04-30 — End: 2023-06-29

## 2023-04-30 NOTE — Progress Notes (Signed)
Lexxy Deroy [id number] Apr 02, 1971 52 y.o.  Subjective:   Patient ID:  Isabel Benson is a 52 y.o. (DOB 1970/11/02) female.  Chief Complaint:  Chief Complaint  Patient presents with   Follow-up    Bipolar Disorder    HPI Isabel Benson presents to the office today for follow-up of Bipolar Disorder after recent initiation of Lurasidone. She is accompanied by her husband.   She reports, "I have had a spike in energy... I need to move." She reports some difficulty sitting still at her desk. She reports that her energy has not been excessive and has been manageable. She and her husband report that restlessness seems to be improving over time. She has been running in the mornings and walking in the evenings. She is getting an adequate amount of sleep. Denies increased goal-directed activity. Denies any impulsive or risky behavior. Denies irritability, elevated moods, or depressed mood.   She has been taking latuda after she eats dinner. She reports that she was getting up earlier and now wake up time has been shifting to her normal schedule. She reports that she is notices some drowsiness around 4 pm, and husband attributes this to her getting up slightly earlier than usual. She reports falling asleep immediately after going to bed. Denies any racing thoughts. Concentration has been ok. Denies SI.   She has stopped caffeine.   Husband reports that she is "a little more animated." Husband reports that he has not noticed any manic s/s.   She reports that they changed their diet to 2/3 of meals being vegetarian. She reports that she has lost some weight and clothes are fitting differently.   Getting ready to go to [place] 05/17/23-05/25/23.   AIMS    Flowsheet Row Office Visit from 04/30/2023 in [facility]  AIMS Total Score 0     Past Psychiatric Medication Trials: Depakote carbamazepine Risperdal  Seroquel   Review of Systems:  Review of Systems   Gastrointestinal:  Negative for diarrhea and nausea.       She reports increased bowel movements.   Endocrine:       Increased thirst  Musculoskeletal:  Negative for gait problem.  Neurological:  Negative for tremors.  Psychiatric/Behavioral:         Please refer to HPI    Medications: I have reviewed the patient's current medications.  Current Outpatient Medications  Medication Sig Dispense Refill   L-Methylfolate 15 MG TABS Take 1 tablet (15 mg total) by mouth daily. 90 tablet 3   omega-3 acid ethyl esters (LOVAZA) 1 g capsule Take 1 capsule (1 g total) by mouth daily. 90 capsule 3   lithium carbonate (ESKALITH) 450 MG ER tablet Take 1 tablet (450 mg total) by mouth at bedtime. 30 tablet 1   LORazepam (ATIVAN) 1 MG tablet Take 1 tablet (1 mg total) by mouth daily as needed for anxiety. (Patient not taking: Reported on 04/30/2023) 15 tablet 0   lurasidone (LATUDA) 40 MG TABS tablet Take 1 tablet (40 mg total) by mouth daily with supper. 90 tablet 0   valACYclovir (VALTREX) 500 MG tablet 1 tab po twice a day at onset of symptoms (Patient not taking: Reported on 04/30/2023)     zolpidem (AMBIEN) 10 MG tablet Take 0.5 tablets (5 mg total) by mouth at bedtime as needed for sleep. 15 tablet 0   No current facility-administered medications for this visit.    Medication Side Effects: Other: Impaired renal function  Allergies:  Allergies  Allergen Reactions   Sudafed [Pseudoephedrine Hcl]     Past Medical History:  Diagnosis Date   Chronic renal failure, stage 3 (moderate) (HCC)    Multiple renal cysts     Past Medical History, Surgical history, Social history, and Family history were reviewed and updated as appropriate.   Please see review of systems for further details on the patient's review from today.   Objective:   Physical Exam:  There were no vitals taken for this visit.  Physical Exam Constitutional:      General: She is not in acute distress. Musculoskeletal:         General: No deformity.  Neurological:     Mental Status: She is alert and oriented to person, place, and time.     Coordination: Coordination normal.  Psychiatric:        Attention and Perception: Attention and perception normal. She does not perceive auditory or visual hallucinations.        Mood and Affect: Mood normal. Mood is not anxious or depressed. Affect is not labile, blunt, angry or inappropriate.        Speech: Speech normal.        Behavior: Behavior normal.        Thought Content: Thought content normal. Thought content is not paranoid or delusional. Thought content does not include homicidal or suicidal ideation. Thought content does not include homicidal or suicidal plan.        Cognition and Memory: Cognition and memory normal.        Judgment: Judgment normal.     Comments: Insight intact     Lab Review:     Component Value Date/Time   NA 137 08/25/2018 0833   K 4.4 08/25/2018 0833   CL 105 08/25/2018 0833   CO2 27 08/25/2018 0833   GLUCOSE 85 08/25/2018 0833   BUN 22 08/25/2018 0833   CREATININE 1.58 (H) 08/25/2018 0833   CALCIUM 9.6 08/25/2018 0833   GFRNONAA 39 (L) 08/25/2018 0833   GFRAA 45 (L) 08/25/2018 0833    No results found for: "WBC", "RBC", "HGB", "HCT", "PLT", "MCV", "MCH", "MCHC", "RDW", "LYMPHSABS", "MONOABS", "EOSABS", "BASOSABS"  Lithium Lvl  Date Value Ref Range Status  05/09/2022 1.0 0.6 - 1.2 mmol/L Final     No results found for: "PHENYTOIN", "PHENOBARB", "VALPROATE", "CBMZ"   .res Assessment: Plan:    33 minutes spent dedicated to the care of this patient on the date of this encounter to include pre-visit review of records, ordering of medication, post visit documentation, and face-to-face time with the patient discussing response to [facility] and goal to reduce and eventually discontinue Lithium due to impaired renal function and continue Lurasidone for mood stabilization. Discussed that kidney function may start to improve with dose  reductions in Lithium. Will decrease Lithium ER from 600 mg at bedtime to 450 mg at bedtime since she is tolerating Latuda. Recommended waiting to decrease Lithium until she returns from trip to [place] since international travel can sometimes increase risk of mania due to jet lag/change in time zones, disruption in usual sleep schedule and routine, excitement with travel, etc. Also discussed wanting to avoid having worsening mood symptoms while she is out of the country. Pt and her husband agree with her continuing Lithium ER 600 mg at bedtime until returning from her trip and then starting 450 mg at bedtime.  Pt plans to have labs drawn to determine baseline kidney function and lithium level prior to dose reduction.  Continue  Latuda 40 mg daily with supper for mood stabilization.  Recommend continuing therapy with Stevphen Meuse, [facility].  Pt to follow-up with this provider in 6 weeks or sooner if clinically indicated.  Patient advised to contact office with any questions, adverse effects, or acute worsening in signs and symptoms.   Minori was seen today for follow-up.  Diagnoses and all orders for this visit:  Bipolar disorder, in full remission, most recent episode depressed (HCC) -     lithium carbonate (ESKALITH) 450 MG ER tablet; Take 1 tablet (450 mg total) by mouth at bedtime. -     lurasidone (LATUDA) 40 MG TABS tablet; Take 1 tablet (40 mg total) by mouth daily with supper.     Please see After Visit Summary for patient specific instructions.  Future Appointments  Date Time Provider Department Center  06/25/2023  9:00 AM Stevphen Meuse, [facility] None  07/09/2023  8:30 AM Corie Chiquito, PMHNP CP-CP None    No orders of the defined types were placed in this encounter.   -------------------------------

## 2023-05-12 LAB — BASIC METABOLIC PANEL
Chloride: 105 mmol/L (ref 98–110)
Glucose, Bld: 83 mg/dL (ref 65–99)
Sodium: 138 mmol/L (ref 135–146)

## 2023-06-25 ENCOUNTER — Ambulatory Visit (INDEPENDENT_AMBULATORY_CARE_PROVIDER_SITE_OTHER): Payer: BC Managed Care – PPO | Admitting: Psychiatry

## 2023-06-25 DIAGNOSIS — F3176 Bipolar disorder, in full remission, most recent episode depressed: Secondary | ICD-10-CM | POA: Diagnosis not present

## 2023-06-25 NOTE — Progress Notes (Signed)
      [facility] Counselor/Therapist Progress Note  Patient ID: Isabel Benson, MRN: [id number],    Date: 06/25/2023  Time Spent: 50 minutes start time 9:00 AM end time 9:50 AM  Treatment Type: Individual Therapy  Reported Symptoms: Anxiety, sleep issues  Mental Status Exam:  Appearance:   Well Groomed     Behavior:  Appropriate  Motor:  Normal  Speech/Language:   Normal Rate  Affect:  Appropriate  Mood:  normal  Thought process:  normal  Thought content:    WNL  Sensory/Perceptual disturbances:    WNL  Orientation:  oriented to person, place, time/date, and situation  Attention:  Good  Concentration:  Good  Memory:  WNL  Fund of knowledge:   Good  Insight:    Good  Judgment:   Good  Impulse Control:  Good   Risk Assessment: Danger to Self:  No Self-injurious Behavior: No Danger to Others: No Duty to Warn:no Physical Aggression / Violence:No  Access to Firearms a concern: No  Gang Involvement:No   Subjective:  Patient was present for session. She shared she had to go through issues with her kidneys so she had to make some medication changes.  She shared that her employee that was difficult finally quit and that has been a huge decrease in stress for her. She went on to share that her work life balance is working well and she feels that will help her be able to go through a medication change.  Patient went on to explain that things are going better with her family overall.  She did have an incident that made her very sad but she was able to realize it was not her issue.  Discussed CBT skills to help her stay grounded when she thinks about the situation with her sister-in-law.  Patient reported feeling much better overall and felt like she had met her goals.  Discussed putting treatment on hold at this time.  Patient will contact office if something change and be worked in for another session  Interventions: Solution-Oriented/Positive Psychology  Diagnosis:   ICD-10-CM    1. Bipolar disorder, in full remission, most recent episode depressed (HCC)  F31.76       Plan:  Patient is to use CBT and coping skills to decrease mood issues.  Patient is to working on limit setting with her family situation.  Patient is to continue working with her providers on her physical issues.  Is to work with a nutritionist. patient is to continue exercising to release negative emotions appropriately.  Patient is to focus on the things that she can do rather than what she cannot.  Is to put case on mold at this time and will contact if she wanted to come back for more sessions.  Patient is to continue taking medication as directed.   Stevphen Meuse, [facility]

## 2023-06-28 ENCOUNTER — Other Ambulatory Visit: Payer: Self-pay | Admitting: Psychiatry

## 2023-06-28 DIAGNOSIS — F3176 Bipolar disorder, in full remission, most recent episode depressed: Secondary | ICD-10-CM

## 2023-06-28 NOTE — Telephone Encounter (Signed)
Has appt. tomorrow

## 2023-06-29 ENCOUNTER — Encounter: Payer: Self-pay | Admitting: Psychiatry

## 2023-06-29 ENCOUNTER — Ambulatory Visit (INDEPENDENT_AMBULATORY_CARE_PROVIDER_SITE_OTHER): Payer: BC Managed Care – PPO | Admitting: Psychiatry

## 2023-06-29 DIAGNOSIS — F3176 Bipolar disorder, in full remission, most recent episode depressed: Secondary | ICD-10-CM | POA: Diagnosis not present

## 2023-06-29 MED ORDER — LITHIUM CARBONATE ER 300 MG PO TBCR
300.0000 mg | EXTENDED_RELEASE_TABLET | Freq: Every day | ORAL | Status: DC
Start: 2023-06-29 — End: 2023-07-19

## 2023-06-29 MED ORDER — L-METHYLFOLATE 15 MG PO TABS
15.0000 mg | ORAL_TABLET | Freq: Every day | ORAL | 3 refills | Status: DC
Start: 2023-06-29 — End: 2023-10-01

## 2023-06-29 MED ORDER — LURASIDONE HCL 40 MG PO TABS
40.0000 mg | ORAL_TABLET | Freq: Every day | ORAL | 0 refills | Status: DC
Start: 2023-06-29 — End: 2023-07-27

## 2023-06-29 MED ORDER — OMEGA-3-ACID ETHYL ESTERS 1 G PO CAPS
1.0000 g | ORAL_CAPSULE | Freq: Every day | ORAL | 3 refills | Status: AC
Start: 2023-06-29 — End: 2024-06-23

## 2023-06-29 NOTE — Progress Notes (Unsigned)
Isabel Benson [id number] 20-Apr-1971 52 y.o.  Subjective:   Patient ID:  Isabel Benson is a 52 y.o. (DOB 1971-06-05) female.  Chief Complaint:  Chief Complaint  Patient presents with   Follow-up    Bipolar disorder, anxiety, and insomnia    HPI Isabel Benson presents to the office today for follow-up of Bipolar Disorder, anxiety, and insomnia. Isabel Benson reports, "I'm doing really good." Isabel Benson denies any worsening symptoms. Isabel Benson reports that Isabel Benson is sleeping very well and sleep score is good. Energy has been adequate and not excessive. Denies increased in goal-directed activity. Isabel Benson reports that Isabel Benson ate more fish on her trip to [place] and this caused gout. Isabel Benson went on course of Prednisone and took it for 4 days and it started to affect her sleep. Mood has been stable. Denies impulsive or risky behavior. Appetite has been good. Concentration has been ok. Denies racing thoughts. Denies anxiety. Denies SI.   Isabel Benson really enjoyed her trip to [place]. Isabel Benson lost time on the trip there. Isabel Benson then started decrease in Lithium from 600 mg to 450 mg at bedtime on 05/26/23.   Past Psychiatric Medication Trials: Depakote carbamazepine Risperdal  Seroquel   AIMS    Flowsheet Row Office Visit from 06/29/2023 in [facility] Office Visit from 04/30/2023 in [facility]  AIMS Total Score 0 0        Review of Systems:  Review of Systems  Musculoskeletal:  Negative for gait problem.       Gout flare  Neurological:  Negative for tremors.  Psychiatric/Behavioral:         Please refer to HPI    Medications: I have reviewed the patient's current medications.  Current Outpatient Medications  Medication Sig Dispense Refill   polyethylene glycol (MIRALAX / GLYCOLAX) 17 g packet Take 17 g by mouth daily.     L-Methylfolate 15 MG TABS Take 1 tablet (15 mg total) by mouth daily. 90 tablet 3   lithium carbonate (LITHOBID) 300 MG ER tablet Take 1 tablet (300  mg total) by mouth at bedtime.     LORazepam (ATIVAN) 1 MG tablet Take 1 tablet (1 mg total) by mouth daily as needed for anxiety. (Patient not taking: Reported on 04/30/2023) 15 tablet 0   lurasidone (LATUDA) 40 MG TABS tablet Take 1 tablet (40 mg total) by mouth daily with supper. 90 tablet 0   omega-3 acid ethyl esters (LOVAZA) 1 g capsule Take 1 capsule (1 g total) by mouth daily. 90 capsule 3   valACYclovir (VALTREX) 500 MG tablet 1 tab po twice a day at onset of symptoms (Patient not taking: Reported on 04/30/2023)     zolpidem (AMBIEN) 10 MG tablet Take 0.5 tablets (5 mg total) by mouth at bedtime as needed for sleep. 15 tablet 0   No current facility-administered medications for this visit.    Medication Side Effects: None  Allergies:  Allergies  Allergen Reactions   Sudafed [Pseudoephedrine Hcl]     Past Medical History:  Diagnosis Date   Chronic renal failure, stage 3 (moderate) (HCC)    Gout    Multiple renal cysts     Past Medical History, Surgical history, Social history, and Family history were reviewed and updated as appropriate.   Please see review of systems for further details on the patient's review from today.   Objective:   Physical Exam:  There were no vitals taken for this visit.  Physical Exam Constitutional:  General: Isabel Benson is not in acute distress. Musculoskeletal:        General: No deformity.  Neurological:     Mental Status: Isabel Benson is alert and oriented to person, place, and time.     Coordination: Coordination normal.  Psychiatric:        Attention and Perception: Attention and perception normal. Isabel Benson does not perceive auditory or visual hallucinations.        Mood and Affect: Mood normal. Mood is not anxious or depressed. Affect is not labile, blunt, angry or inappropriate.        Speech: Speech normal.        Behavior: Behavior normal.        Thought Content: Thought content normal. Thought content is not paranoid or delusional. Thought content  does not include homicidal or suicidal ideation. Thought content does not include homicidal or suicidal plan.        Cognition and Memory: Cognition and memory normal.        Judgment: Judgment normal.     Comments: Insight intact     Lab Review:     Component Value Date/Time   NA 138 05/11/2023 1012   K 4.5 05/11/2023 1012   CL 105 05/11/2023 1012   CO2 28 05/11/2023 1012   GLUCOSE 83 05/11/2023 1012   BUN 34 (H) 05/11/2023 1012   CREATININE 2.14 (H) 05/11/2023 1012   CALCIUM 10.3 05/11/2023 1012   GFRNONAA 39 (L) 08/25/2018 0833   GFRAA 45 (L) 08/25/2018 0833    No results found for: "WBC", "RBC", "HGB", "HCT", "PLT", "MCV", "MCH", "MCHC", "RDW", "LYMPHSABS", "MONOABS", "EOSABS", "BASOSABS"  Lithium Lvl  Date Value Ref Range Status  05/11/2023 1.0 0.6 - 1.2 mmol/L Final     No results found for: "PHENYTOIN", "PHENOBARB", "VALPROATE", "CBMZ"   .res Assessment: Plan:    29 minutes spent dedicated to the care of this patient on the date of this encounter to include pre-visit review of records, ordering of medication, post visit documentation, and face-to-face time with the patient discussing response to decrease in Lithium and ongoing treatment plan related to Lithium. Isabel Benson reports that her mood has remained stable without any manic symptoms with decrease in Lithium ER from 600 mg to 450 mg at bedtime. Will decrease Lithium ER at this time from 450 mg at bedtime to 300 mg at bedtime. Isabel Benson reports that Isabel Benson has Lithium ER 300 mg tablets remaining at home and does not need a new script.  Will continue Latuda 40 mg daily with supper for mood stabilization. Discussed considering increase in Latuda dose if Isabel Benson were to experience any breakthrough symptoms of mania.  Continue L-Methylfolate and Lovaza for mood symptoms. Isabel Benson reports that Isabel Benson has not needed Lorazepam prn or Ambien prn recently.  Isabel Benson to follow-up with this provider in 4 weeks or sooner if clinically indicated.  Patient advised  to contact office with any questions, adverse effects, or acute worsening in signs and symptoms.   Isabel Benson was seen today for follow-up.  Diagnoses and all orders for this visit:  Bipolar disorder, in full remission, most recent episode depressed (HCC) -     L-Methylfolate 15 MG TABS; Take 1 tablet (15 mg total) by mouth daily. -     omega-3 acid ethyl esters (LOVAZA) 1 g capsule; Take 1 capsule (1 g total) by mouth daily. -     lurasidone (LATUDA) 40 MG TABS tablet; Take 1 tablet (40 mg total) by mouth daily with supper. -  lithium carbonate (LITHOBID) 300 MG ER tablet; Take 1 tablet (300 mg total) by mouth at bedtime.     Please see After Visit Summary for patient specific instructions.  Future Appointments  Date Time Provider Department Center  07/27/2023  1:00 PM Corie Chiquito, PMHNP [facility] None    No orders of the defined types were placed in this encounter.   -------------------------------

## 2023-07-05 ENCOUNTER — Other Ambulatory Visit: Payer: Self-pay | Admitting: Psychiatry

## 2023-07-05 DIAGNOSIS — F3176 Bipolar disorder, in full remission, most recent episode depressed: Secondary | ICD-10-CM

## 2023-07-09 ENCOUNTER — Ambulatory Visit: Payer: BC Managed Care – PPO | Admitting: Psychiatry

## 2023-07-19 ENCOUNTER — Other Ambulatory Visit: Payer: Self-pay

## 2023-07-19 DIAGNOSIS — F3176 Bipolar disorder, in full remission, most recent episode depressed: Secondary | ICD-10-CM

## 2023-07-19 MED ORDER — LITHIUM CARBONATE ER 300 MG PO TBCR: 300.0000 mg | EXTENDED_RELEASE_TABLET | Freq: Every day | ORAL | 0 refills | Status: DC

## 2023-07-27 ENCOUNTER — Encounter: Payer: Self-pay | Admitting: Psychiatry

## 2023-07-27 ENCOUNTER — Ambulatory Visit (INDEPENDENT_AMBULATORY_CARE_PROVIDER_SITE_OTHER): Payer: BC Managed Care – PPO | Admitting: Psychiatry

## 2023-07-27 DIAGNOSIS — F99 Mental disorder, not otherwise specified: Secondary | ICD-10-CM | POA: Diagnosis not present

## 2023-07-27 DIAGNOSIS — F5105 Insomnia due to other mental disorder: Secondary | ICD-10-CM

## 2023-07-27 DIAGNOSIS — F3176 Bipolar disorder, in full remission, most recent episode depressed: Secondary | ICD-10-CM | POA: Diagnosis not present

## 2023-07-27 MED ORDER — LORAZEPAM 1 MG PO TABS: 1.0000 mg | ORAL_TABLET | Freq: Every day | ORAL | 0 refills | Status: DC | PRN

## 2023-07-27 MED ORDER — LURASIDONE HCL 40 MG PO TABS: 40.0000 mg | ORAL_TABLET | Freq: Every day | ORAL | 0 refills | Status: DC

## 2023-07-27 MED ORDER — LITHIUM CARBONATE 150 MG PO CAPS: 150.0000 mg | ORAL_CAPSULE | Freq: Every day | ORAL | 0 refills | Status: DC

## 2023-07-27 MED ORDER — ZOLPIDEM TARTRATE 10 MG PO TABS: 5.0000 mg | ORAL_TABLET | Freq: Every evening | ORAL | 0 refills | Status: DC | PRN

## 2023-07-27 NOTE — Progress Notes (Signed)
Kelvin Burpee [id number] 1971-07-24 52 y.o.  Subjective:   Patient ID:  Isabel Benson is a 52 y.o. (DOB 01-Nov-1970) female.  Chief Complaint:  Chief Complaint  Patient presents with   Follow-up    Bipolar Disorder    HPI Isabel Benson presents to the office today for follow-up of Bipolar Disorder. "I've been doing well. I have no symptoms, no complaints." Denies any changes in mood with further decrease in Lithium. She reports that her weight has increased slightly. Weight is now around 150 lbs and was previously maintaining 145 lbs. She report that her weight has fluctuated some. She is trying to eat vegetarian. She continues to run 5 miles and 8 miles each week and additional walking/running during the week. She notices that she is having increased hunger in the mornings. She reports that she sleeps very well. She reports that her sleep tracker indicates improved sleep. She denies any significant anxiety. Energy and motivation have been good. Concentration is adequate. Denies any manic symptoms. Denies SI.   They have been going to church regularly.   Past Psychiatric Medication Trials: Depakote carbamazepine Risperdal  Seroquel  Lexapro  AIMS    Flowsheet Row Office Visit from 06/29/2023 in [facility] Office Visit from 04/30/2023 in [facility]  AIMS Total Score 0 0        Review of Systems:  Review of Systems  Musculoskeletal:  Negative for gait problem.       Hip pain has improved with some exercises.   Neurological:  Negative for tremors.  Psychiatric/Behavioral:         Please refer to HPI    Medications: I have reviewed the patient's current medications.  Current Outpatient Medications  Medication Sig Dispense Refill   lithium carbonate 150 MG capsule Take 1 capsule (150 mg total) by mouth at bedtime for 14 days. Then stop. 14 capsule 0   L-Methylfolate 15 MG TABS Take 1 tablet (15 mg total) by mouth  daily. 90 tablet 3   LORazepam (ATIVAN) 1 MG tablet Take 1 tablet (1 mg total) by mouth daily as needed for anxiety. 15 tablet 0   lurasidone (LATUDA) 40 MG TABS tablet Take 1 tablet (40 mg total) by mouth daily with supper. 90 tablet 0   omega-3 acid ethyl esters (LOVAZA) 1 g capsule Take 1 capsule (1 g total) by mouth daily. 90 capsule 3   polyethylene glycol (MIRALAX / GLYCOLAX) 17 g packet Take 17 g by mouth daily.     valACYclovir (VALTREX) 500 MG tablet 1 tab po twice a day at onset of symptoms (Patient not taking: Reported on 04/30/2023)     zolpidem (AMBIEN) 10 MG tablet Take 0.5 tablets (5 mg total) by mouth at bedtime as needed for sleep. 15 tablet 0   No current facility-administered medications for this visit.    Medication Side Effects: Other: Some increase in weight  Allergies:  Allergies  Allergen Reactions   Sudafed [Pseudoephedrine Hcl]     Past Medical History:  Diagnosis Date   Chronic renal failure, stage 3 (moderate) (HCC)    Gout    Multiple renal cysts     Past Medical History, Surgical history, Social history, and Family history were reviewed and updated as appropriate.   Please see review of systems for further details on the patient's review from today.   Objective:   Physical Exam:  There were no vitals taken for this visit.  Physical Exam Constitutional:  General: She is not in acute distress. Musculoskeletal:        General: No deformity.  Neurological:     Mental Status: She is alert and oriented to person, place, and time.     Coordination: Coordination normal.  Psychiatric:        Attention and Perception: Attention and perception normal. She does not perceive auditory or visual hallucinations.        Mood and Affect: Mood normal. Mood is not anxious or depressed. Affect is not labile, blunt, angry or inappropriate.        Speech: Speech normal.        Behavior: Behavior normal.        Thought Content: Thought content normal. Thought  content is not paranoid or delusional. Thought content does not include homicidal or suicidal ideation. Thought content does not include homicidal or suicidal plan.        Cognition and Memory: Cognition and memory normal.        Judgment: Judgment normal.     Comments: Insight intact     Lab Review:     Component Value Date/Time   NA 138 05/11/2023 1012   K 4.5 05/11/2023 1012   CL 105 05/11/2023 1012   CO2 28 05/11/2023 1012   GLUCOSE 83 05/11/2023 1012   BUN 34 (H) 05/11/2023 1012   CREATININE 2.14 (H) 05/11/2023 1012   CALCIUM 10.3 05/11/2023 1012   GFRNONAA 39 (L) 08/25/2018 0833   GFRAA 45 (L) 08/25/2018 0833    No results found for: "WBC", "RBC", "HGB", "HCT", "PLT", "MCV", "MCH", "MCHC", "RDW", "LYMPHSABS", "MONOABS", "EOSABS", "BASOSABS"  Lithium Lvl  Date Value Ref Range Status  05/11/2023 1.0 0.6 - 1.2 mmol/L Final     No results found for: "PHENYTOIN", "PHENOBARB", "VALPROATE", "CBMZ"   .res Assessment: Plan:    33 minutes spent dedicated to the care of this patient on the date of this encounter to include pre-visit review of records, ordering of medication, post visit documentation, and face-to-face time with the patient discussing response to continued dose reduction in Lithium and plan to continue to decrease and discontinue Lithium due to impaired kidney function. Will decrease Lithium to 150 mg at bedtime for 2 weeks, then stop.  Will continue Latuda 40 mg daily with supper for mood stabilization.  Continue Ambien 5 mg po at bedtime prn insomnia.  Continue Lorazepam 1 mg as needed for anxiety or mania.  Continue L-Methylfolate and Lovaza for mood symptoms.  Pt to follow-up with this provider in 6 weeks or sooner if clinically indicated.  Patient advised to contact office with any questions, adverse effects, or acute worsening in signs and symptoms.   Isabel Benson was seen today for follow-up.  Diagnoses and all orders for this visit:  Bipolar disorder, in  full remission, most recent episode depressed (HCC) -     lithium carbonate 150 MG capsule; Take 1 capsule (150 mg total) by mouth at bedtime for 14 days. Then stop. -     lurasidone (LATUDA) 40 MG TABS tablet; Take 1 tablet (40 mg total) by mouth daily with supper.  Insomnia due to other mental disorder -     LORazepam (ATIVAN) 1 MG tablet; Take 1 tablet (1 mg total) by mouth daily as needed for anxiety. -     zolpidem (AMBIEN) 10 MG tablet; Take 0.5 tablets (5 mg total) by mouth at bedtime as needed for sleep.     Please see After Visit Summary for patient specific  instructions.  Future Appointments  Date Time Provider Department Center  09/07/2023  1:00 PM Corie Chiquito, PMHNP [facility] None     No orders of the defined types were placed in this encounter.   -------------------------------

## 2023-08-05 ENCOUNTER — Encounter: Payer: Self-pay | Admitting: Psychiatry

## 2023-08-06 ENCOUNTER — Other Ambulatory Visit: Payer: Self-pay | Admitting: Psychiatry

## 2023-08-06 DIAGNOSIS — F3176 Bipolar disorder, in full remission, most recent episode depressed: Secondary | ICD-10-CM

## 2023-08-19 ENCOUNTER — Other Ambulatory Visit: Payer: Self-pay | Admitting: Psychiatry

## 2023-08-19 DIAGNOSIS — F3176 Bipolar disorder, in full remission, most recent episode depressed: Secondary | ICD-10-CM

## 2023-08-24 ENCOUNTER — Telehealth: Payer: Self-pay | Admitting: Psychiatry

## 2023-08-24 DIAGNOSIS — F3176 Bipolar disorder, in full remission, most recent episode depressed: Secondary | ICD-10-CM

## 2023-08-24 MED ORDER — LURASIDONE HCL 60 MG PO TABS: 60.0000 mg | ORAL_TABLET | Freq: Every day | ORAL | 0 refills | Status: DC

## 2023-08-24 NOTE — Telephone Encounter (Signed)
Agree with increasing Latuda to 60 mg daily with supper. Script has been sent to her pharmacy. Please advise her to call with any questions or side effects.

## 2023-08-24 NOTE — Telephone Encounter (Signed)
Patient notified of new dose/Rx.

## 2023-08-24 NOTE — Telephone Encounter (Signed)
Patient called and LM. She has been taking the Latuda 40mg  for a while now. She feels like she would like to increase the dose. Please call.

## 2023-08-24 NOTE — Telephone Encounter (Signed)
Patient reporting being completely off lithium for 2 weeks. She reports the Jordan was keeping her calm since stopping the lithium. She is reporting a stressful TG with lots of family in her home and things didn't go smoothly. She said she had to take lorazepam last night. She knows that this could be stress from the holiday and she made it thru, but is worried about Christmas. She reports no side effects with Latuda and said you had mentioned that 40 mg might be the best therapeutic dose. She hasn't slept well the last few days, but again it could be the holiday stress. She has FU 12/16.

## 2023-08-24 NOTE — Telephone Encounter (Signed)
LVM to RC 

## 2023-08-31 ENCOUNTER — Telehealth: Payer: Self-pay

## 2023-08-31 DIAGNOSIS — F3176 Bipolar disorder, in full remission, most recent episode depressed: Secondary | ICD-10-CM

## 2023-08-31 DIAGNOSIS — F5105 Insomnia due to other mental disorder: Secondary | ICD-10-CM

## 2023-08-31 MED ORDER — OXCARBAZEPINE 300 MG PO TABS: ORAL_TABLET | ORAL | 1 refills | Status: DC

## 2023-08-31 NOTE — Telephone Encounter (Signed)
Called pt to find out more information. She reports that her mother is not doing well physically and a mass was found on her adrenal glands. Father has dementia. She reports some "unexpected stuff." She reports that "this week has been bit a rocky." She has not been sleeping well and her sleep scores have decline. She reports sleeping less and slept 6 hours fragmented the last 2 nights. Took a Lorazepam yesterday and plans to take another Lorazepam at the end of the day. She reports, "I feel like my mind is clear. It's the anxiety." She reports that "what I am experiencing is more on the manic side." She reports increased energy and her husband has noticed this. Denies increased goal-directed activity. Denies racing thoughts. She has stopped caffeine.   Discussed potential benefits, risks, and side effects of Trileptal. Will start Trileptal 150 mg for 3-5 days, then increase to 300 mg at bedtime for mood stabilization and to improve mood, anxiety, and insomnia. Advised pt to call back if symptoms worsen or do not improve.

## 2023-08-31 NOTE — Telephone Encounter (Signed)
From 12/2:  Patient reporting being completely off lithium for 2 weeks. She reports the Jordan was keeping her calm since stopping the lithium. She is reporting a stressful TG with lots of family in her home and things didn't go smoothly. She said she had to take lorazepam last night. She knows that this could be stress from the holiday and she made it thru, but is worried about Christmas. She reports no side effects with Latuda and said you had mentioned that 40 mg might be the best therapeutic dose. She hasn't slept well the last few days, but again it could be the holiday stress. She has FU 12/16.   Latuda was increased to 60 mg. She is reporting sweaty hands and feet and she had a PA yesterday that required lorazepam. She said she isn't sure if this is medication related, bipolar related, or menopause related. She has an appt 12/16, but wants to try to get a handle on this before then.

## 2023-09-04 NOTE — Telephone Encounter (Signed)
Attempted to call pt to follow-up on her mood and insomnia. L/M for pt and advised that she call office if she has any concerns or if she is experiencing any worsening symptoms.

## 2023-09-07 ENCOUNTER — Encounter: Payer: Self-pay | Admitting: Psychiatry

## 2023-09-07 ENCOUNTER — Ambulatory Visit (INDEPENDENT_AMBULATORY_CARE_PROVIDER_SITE_OTHER): Payer: BC Managed Care – PPO | Admitting: Psychiatry

## 2023-09-07 DIAGNOSIS — F310 Bipolar disorder, current episode hypomanic: Secondary | ICD-10-CM | POA: Diagnosis not present

## 2023-09-07 DIAGNOSIS — F5105 Insomnia due to other mental disorder: Secondary | ICD-10-CM | POA: Diagnosis not present

## 2023-09-07 DIAGNOSIS — F99 Mental disorder, not otherwise specified: Secondary | ICD-10-CM

## 2023-09-07 DIAGNOSIS — F3176 Bipolar disorder, in full remission, most recent episode depressed: Secondary | ICD-10-CM

## 2023-09-07 MED ORDER — LORAZEPAM 1 MG PO TABS: 1.0000 mg | ORAL_TABLET | Freq: Every day | ORAL | 0 refills | Status: DC | PRN

## 2023-09-07 MED ORDER — ZOLPIDEM TARTRATE 10 MG PO TABS: 5.0000 mg | ORAL_TABLET | Freq: Every evening | ORAL | 0 refills | Status: DC | PRN

## 2023-09-07 MED ORDER — LURASIDONE HCL 80 MG PO TABS: 80.0000 mg | ORAL_TABLET | Freq: Every day | ORAL | 1 refills | Status: DC

## 2023-09-07 NOTE — Progress Notes (Signed)
Nickayla Albiter [id number] [birth year] 52 y.o.  Subjective:   Patient ID:  Isabel Benson is a 52 y.o. (DOB [birth year]) female.  Chief Complaint:  Chief Complaint  Patient presents with   Manic Behavior   Sleeping Problem    HPI Isabel Benson presents to the office today for follow-up of Bipolar disorder and sleep disturbance. She is accompanied by her husband, Isabel Leriche.   She reports that anxiety started to improve late last week and returned today. She has been up since 5 am trying to complete tasks for work. She learned today that sister passed out and will not be able to keep plans they made for this evening and this was disappointing and caused some anxiety. She reports that early last week she felt anxiety like she was "about to have a piano recital" with a "rumble" in her stomach/gut. She reports that her sleep has been disrupted and has had limited improvement after taking Lorazepam. She took Ambien Saturday night and did not go to sleep right away and had a headache the following day. She took a 1/2 tablet last night and slept 10:30-1:15 am, woke up briefly, and then "slept hard." Her sleep has not been consistent. She estimates sleeping 5-6 hours a night on average. She reports that she was sleeping very well prior to Thanksgiving.   She reports, "my head is really clear." She denies feeling jittery. Denies rumination. They are noticing she is having some difficulty with concentration. She has been organizing and re-arranging things. Energy has been elevated. They deny impulsive or risky behavior. She denies excessive spending and is aware of budget. She reports that her mood is "happy." Denies irritability. She has been more talkative.  Denies SI.   Past Psychiatric Medication Trials: Depakote carbamazepine Risperdal  Seroquel  Lexapro  AIMS    Flowsheet Row Office Visit from 09/07/2023 in [facility] Office Visit from 06/29/2023 in [facility]  [facility] Office Visit from 04/30/2023 in [facility]  AIMS Total Score 0 0 0        Review of Systems:  Review of Systems  Endocrine:       She reports that she no longer has excessive thirst since stopping Lithium  Genitourinary:  Negative for urgency.  Musculoskeletal:  Negative for gait problem.  Neurological:  Negative for tremors.  Psychiatric/Behavioral:         Please refer to HPI    Medications: I have reviewed the patient's current medications.  Current Outpatient Medications  Medication Sig Dispense Refill   Oxcarbazepine (TRILEPTAL) 300 MG tablet Take 1/2 tablet po qhs x 3-5 days, then increase to 1 tablet po qhs 30 tablet 1   L-Methylfolate 15 MG TABS Take 1 tablet (15 mg total) by mouth daily. 90 tablet 3   LORazepam (ATIVAN) 1 MG tablet Take 1 tablet (1 mg total) by mouth daily as needed for anxiety. 15 tablet 0   lurasidone (LATUDA) 80 MG TABS tablet Take 1 tablet (80 mg total) by mouth daily with supper. 30 tablet 1   omega-3 acid ethyl esters (LOVAZA) 1 g capsule Take 1 capsule (1 g total) by mouth daily. 90 capsule 3   polyethylene glycol (MIRALAX / GLYCOLAX) 17 g packet Take 17 g by mouth daily.     valACYclovir (VALTREX) 500 MG tablet 1 tab po twice a day at onset of symptoms (Patient not taking: Reported on 04/30/2023)     zolpidem (AMBIEN) 10 MG tablet Take  0.5 tablets (5 mg total) by mouth at bedtime as needed for sleep. 15 tablet 0   No current facility-administered medications for this visit.    Medication Side Effects: None  Allergies:  Allergies  Allergen Reactions   Sudafed [Pseudoephedrine Hcl]     Past Medical History:  Diagnosis Date   Chronic renal failure, stage 3 (moderate) (HCC)    Gout    Multiple renal cysts     Past Medical History, Surgical history, Social history, and Family history were reviewed and updated as appropriate.   Please see review of systems for further details on the  patient's review from today.   Objective:   Physical Exam:  There were no vitals taken for this visit.  Physical Exam Constitutional:      General: She is not in acute distress. Musculoskeletal:        General: No deformity.  Neurological:     Mental Status: She is alert and oriented to person, place, and time.     Coordination: Coordination normal.  Psychiatric:        Attention and Perception: Attention and perception normal. She does not perceive auditory or visual hallucinations.        Mood and Affect: Mood is anxious. Mood is not depressed. Affect is not labile, blunt, angry or inappropriate.        Speech: Speech normal.        Behavior: Behavior is cooperative.        Thought Content: Thought content normal. Thought content is not paranoid or delusional. Thought content does not include homicidal or suicidal ideation. Thought content does not include homicidal or suicidal plan.        Cognition and Memory: Cognition and memory normal.        Judgment: Judgment normal.     Comments: Insight intact More talkative compared to baseline Thought processes are not as linear and goal-directed compared to baseline     Lab Review:     Component Value Date/Time   NA 138 05/11/2023 1012   K 4.5 05/11/2023 1012   CL 105 05/11/2023 1012   CO2 28 05/11/2023 1012   GLUCOSE 83 05/11/2023 1012   BUN 34 (H) 05/11/2023 1012   CREATININE 2.14 (H) 05/11/2023 1012   CALCIUM 10.3 05/11/2023 1012   GFRNONAA 39 (L) 08/25/2018 0833   GFRAA 45 (L) 08/25/2018 0833    No results found for: "WBC", "RBC", "HGB", "HCT", "PLT", "MCV", "MCH", "MCHC", "RDW", "LYMPHSABS", "MONOABS", "EOSABS", "BASOSABS"  Lithium Lvl  Date Value Ref Range Status  05/11/2023 1.0 0.6 - 1.2 mmol/L Final     No results found for: "PHENYTOIN", "PHENOBARB", "VALPROATE", "CBMZ"   .res Assessment: Plan:   45 minutes spent dedicated to the care of this patient on the date of this encounter to include pre-visit review  of records, ordering of medication, post visit documentation, and face-to-face time with the patient and her husband discussing symptoms that are indicative of a manic episode and that pt is exhibiting several symptoms of early mania/hypomania to include: decreased sleep, increased goal-directed activity, racing thoughts, slight irritation at times, and increased talkativeness. Recommended increasing Latuda to 80 mg daily with supper to help stabilize mood, anxiety, and insomnia. Advised pt to take Latuda with food to increase absorption.  Advised pt to increase Trileptal to 300 mg at bedtime after several days, when response to increase in Jordan is determined, since this may be helpful for her mood, anxiety, and insomnia.  Discussed using  Ambien prn to ensure adequate sleep since this will also be helpful in terms of preventing escalation of mania.  Continue Lorazepam as needed for anxiety and manic symptoms.  Pt to follow-up with this provider in 1 week or sooner if clinically indicated.  Patient advised to contact office with any questions, adverse effects, or acute worsening in signs and symptoms.   Titanna was seen today for manic behavior and sleeping problem.  Diagnoses and all orders for this visit:  Bipolar affective disorder, current episode hypomanic (HCC) -     Discontinue: lurasidone (LATUDA) 80 MG TABS tablet; Take 1 tablet (80 mg total) by mouth daily with breakfast. -     lurasidone (LATUDA) 80 MG TABS tablet; Take 1 tablet (80 mg total) by mouth daily with supper.  Insomnia due to other mental disorder -     LORazepam (ATIVAN) 1 MG tablet; Take 1 tablet (1 mg total) by mouth daily as needed for anxiety. -     zolpidem (AMBIEN) 10 MG tablet; Take 0.5 tablets (5 mg total) by mouth at bedtime as needed for sleep.     Please see After Visit Summary for patient specific instructions.  Future Appointments  Date Time Provider Department Center  09/14/2023 11:30 AM Corie Chiquito, PMHNP [facility] None  09/29/2023  9:00 AM Stevphen Meuse, [facility] None     No orders of the defined types were placed in this encounter.   -------------------------------

## 2023-09-08 MED ORDER — LURASIDONE HCL 80 MG PO TABS: 80.0000 mg | ORAL_TABLET | Freq: Every day | ORAL | 1 refills | Status: DC

## 2023-09-09 ENCOUNTER — Telehealth: Payer: Self-pay | Admitting: Psychiatry

## 2023-09-09 DIAGNOSIS — F5105 Insomnia due to other mental disorder: Secondary | ICD-10-CM

## 2023-09-09 MED ORDER — ZOLPIDEM TARTRATE ER 12.5 MG PO TBCR: 12.5000 mg | EXTENDED_RELEASE_TABLET | Freq: Every evening | ORAL | 0 refills | Status: AC | PRN

## 2023-09-09 NOTE — Telephone Encounter (Signed)
Next visit is 09/14/23. Isabel Benson called and was seen  this past Monday. She states her sleep is not consistent after 4 hours. She continues to have trouble with sleeping. She wants to know if she can take an Ambien at bed and when she wakes up. Or she needs another sleep aid that is stronger. Phone number is [phone]. Her spouse wants to know how much Ambien should she be taking, 2-1/2 pills or a whole pill?

## 2023-09-09 NOTE — Telephone Encounter (Signed)
Patient notified of new Ambien Rx and recommendations for how to take, increase the Trileptal.

## 2023-09-09 NOTE — Telephone Encounter (Signed)
Please let her know that a script has been sent for Ambien CR 12.5 mg at bedtime and that this is extended release and should last longer than 4 hours. It is also a stronger dose than the 1/2 tab she has been taking and is more comparable to a whole tablet of the immediate release that she has been taking. Please advise her to take a whole Ambien CR tonight instead of the immediate release Zolpidem (Ambien) and to call back tomorrow with an update on how this goes. Ok to also go ahead and increase Trileptal to 300 mg at bedtime since she seems to be tolerating everything.

## 2023-09-09 NOTE — Telephone Encounter (Signed)
Patient was seen 2 days ago. Latuda was increased from 60 to 80 mg. She said she had not been taking with 350 calories, was taking it later at night. She is now taking with supper and even though it has only been 2 days feels a little better, but reporting mania is still there. She has not increased the Trileptal yet, as discussed. She said she is taking up at 2:00 AM and is asking if she can take another 1/2 Ambien if she wakes up with at least 4 hours before she needs to get up for work.  She said starting next week she will be working from home and has some wiggle room on when she needs to get up.  Affect is bright.

## 2023-09-11 NOTE — Telephone Encounter (Signed)
Patient's husband called in stating that the medication is not working and the "manic is breaking through."  He says there are no side effects and would like to speak with JC. He was not very forthcoming with information and just wanted to speak with JC or clinical staff. Pls rtc [phone] Isabel Benson does have appt on Monday

## 2023-09-14 ENCOUNTER — Telehealth (INDEPENDENT_AMBULATORY_CARE_PROVIDER_SITE_OTHER): Payer: BC Managed Care – PPO | Admitting: Psychiatry

## 2023-09-14 ENCOUNTER — Encounter: Payer: Self-pay | Admitting: Psychiatry

## 2023-09-14 DIAGNOSIS — F5105 Insomnia due to other mental disorder: Secondary | ICD-10-CM

## 2023-09-14 DIAGNOSIS — F99 Mental disorder, not otherwise specified: Secondary | ICD-10-CM

## 2023-09-14 DIAGNOSIS — F310 Bipolar disorder, current episode hypomanic: Secondary | ICD-10-CM

## 2023-09-14 MED ORDER — LURASIDONE HCL 120 MG PO TABS: 120.0000 mg | ORAL_TABLET | Freq: Every day | ORAL | 1 refills | Status: DC

## 2023-09-14 MED ORDER — CLONAZEPAM 1 MG PO TABS: ORAL_TABLET | ORAL | 0 refills | Status: AC

## 2023-09-14 MED ORDER — DIVALPROEX SODIUM ER 500 MG PO TB24: ORAL_TABLET | ORAL | 1 refills | Status: DC

## 2023-09-14 NOTE — Telephone Encounter (Signed)
Patient has appt today.

## 2023-09-14 NOTE — Progress Notes (Signed)
Isabel Benson [id number] 1971-01-16 52 y.o.  Virtual Visit via Video Note  I connected with pt @ on 09/14/23 at 11:30 AM EST by a video enabled telemedicine application and verified that I am speaking with the correct person using two identifiers.   I discussed the limitations of evaluation and management by telemedicine and the availability of in person appointments. The patient expressed understanding and agreed to proceed.  I discussed the assessment and treatment plan with the patient. The patient was provided an opportunity to ask questions and all were answered. The patient agreed with the plan and demonstrated an understanding of the instructions.   The patient was advised to call back or seek an in-person evaluation if the symptoms worsen or if the condition fails to improve as anticipated.  I provided 45 minutes of non-face-to-face time during this encounter.  The patient was located at home.  The provider was located at home.   Corie Chiquito, PMHNP   Subjective:   Patient ID:  Isabel Benson is a 52 y.o. (DOB 01/11/1971) female.  Chief Complaint:  Chief Complaint  Patient presents with   Manic Behavior   Insomnia    HPI Isabel Benson presents for follow-up of mania and sleep disturbance. She is accompanied by her husband. They report that she is continuing to experience some manic behavior and sleep disturbance. They reports that they added 40 mg of Latuda in the morning on Saturday, 09/12/23. She is also continuing to take Latuda 80 mg in the evening. She reports that she is sleepy about 2 hours after taking medications. She reports that she continues to wake up around 4-4:30 am. Sleeping about 6 hours. She then goes into another room and tries to have quiet time. She reports that she is usually not able to return to sleep. She reports that she is perspiring more. She reports, "I don't feel elevated happy, but I feel happy." She reports that she did not do activities  that would over-stimulate her, especially in the evenings. She reports that "it takes me a little longer to get my thoughts together" and she it is taking longer to articulate what she wants to convey. She reports that she has difficulty sustaining focus and will start a task and then switch to another task. She reports, "I don't feel as confident." Energy has been higher. She reports that she is able to control her anxiety. She reports that her appetite is "pretty good." She is eating snacks. Denies SI.   She reports some sadness in response to how she is feeling. "I feel more of a range of emotions than I have had in 36 years."   Husband reports that "mania is breaking through at 4 am." Husband reports that mania is "progressing slower" than it has in the past. Diminished concentration. He notices some increased goal-directed activity. Husband reports that it is taking her longer to do routine tasks. He reports that she may be spending more than usual at the grocery store.   She has taken Lorazepam prn about 3-4 times. She has taken this when she has felt anxious. She reports that it takes awhile for her anxiety to subside. They report that Lorazepam seems to last about 4 hours.   She is off for a few days and is working from home.   Past Psychiatric Medication Trials: Depakote Carbamazepine Trileptal Risperdal- "heavy" Seroquel  Latuda Lexapro  Review of Systems:  Review of Systems  Gastrointestinal: Negative.   Musculoskeletal:  Negative for gait  problem.  Neurological:  Negative for tremors.  Psychiatric/Behavioral:         Please refer to HPI    Medications: I have reviewed the patient's current medications.  Current Outpatient Medications  Medication Sig Dispense Refill   clonazePAM (KLONOPIN) 1 MG tablet Take 1/2-1 tablet three times daily 90 tablet 0   divalproex (DEPAKOTE ER) 500 MG 24 hr tablet Take 1 tablet at bedtime for 4-5 nights, then increase to 2 tablets at bedtime.  60 tablet 1   L-Methylfolate 15 MG TABS Take 1 tablet (15 mg total) by mouth daily. 90 tablet 3   omega-3 acid ethyl esters (LOVAZA) 1 g capsule Take 1 capsule (1 g total) by mouth daily. 90 capsule 3   zolpidem (AMBIEN CR) 12.5 MG CR tablet Take 1 tablet (12.5 mg total) by mouth at bedtime as needed for sleep. 30 tablet 0   Lurasidone HCl (LATUDA) 120 MG TABS Take 1 tablet (120 mg total) by mouth daily with supper. 30 tablet 1   polyethylene glycol (MIRALAX / GLYCOLAX) 17 g packet Take 17 g by mouth daily.     valACYclovir (VALTREX) 500 MG tablet 1 tab po twice a day at onset of symptoms (Patient not taking: Reported on 04/30/2023)     No current facility-administered medications for this visit.    Medication Side Effects: Other: Initially had a headache upon awakening and this has improved.   Allergies:  Allergies  Allergen Reactions   Sudafed [Pseudoephedrine Hcl]     Past Medical History:  Diagnosis Date   Chronic renal failure, stage 3 (moderate) (HCC)    Gout    Multiple renal cysts     Family History  Problem Relation Age of Onset   Mood Disorder Father    Anxiety disorder Sister    Depression Sister    Depression Maternal Grandmother     Social History   Socioeconomic History   Marital status: Married    Spouse name: Not on file   Number of children: Not on file   Years of education: Not on file   Highest education level: Not on file  Occupational History   Not on file  Tobacco Use   Smoking status: Never   Smokeless tobacco: Never  Substance and Sexual Activity   Alcohol use: Not Currently   Drug use: Never   Sexual activity: Not on file  Other Topics Concern   Not on file  Social History Narrative   Not on file   Social Drivers of Health   Financial Resource Strain: Not on file  Food Insecurity: Not on file  Transportation Needs: Not on file  Physical Activity: Not on file  Stress: Not on file  Social Connections: Not on file  Intimate Partner  Violence: Not on file    Past Medical History, Surgical history, Social history, and Family history were reviewed and updated as appropriate.   Please see review of systems for further details on the patient's review from today.   Objective:   Physical Exam:  There were no vitals taken for this visit.  Physical Exam Neurological:     Mental Status: She is alert and oriented to person, place, and time.     Cranial Nerves: No dysarthria.  Psychiatric:        Attention and Perception: Attention and perception normal.        Mood and Affect: Mood is anxious. Affect is labile.        Speech: Speech normal.  Behavior: Behavior is hyperactive.        Thought Content: Thought content normal. Thought content is not paranoid or delusional. Thought content does not include homicidal or suicidal ideation. Thought content does not include homicidal or suicidal plan.        Cognition and Memory: Cognition and memory normal.        Judgment: Judgment normal.     Comments: Insight intact Elevated mood     Lab Review:     Component Value Date/Time   NA 138 05/11/2023 1012   K 4.5 05/11/2023 1012   CL 105 05/11/2023 1012   CO2 28 05/11/2023 1012   GLUCOSE 83 05/11/2023 1012   BUN 34 (H) 05/11/2023 1012   CREATININE 2.14 (H) 05/11/2023 1012   CALCIUM 10.3 05/11/2023 1012   GFRNONAA 39 (L) 08/25/2018 0833   GFRAA 45 (L) 08/25/2018 0833    No results found for: "WBC", "RBC", "HGB", "HCT", "PLT", "MCV", "MCH", "MCHC", "RDW", "LYMPHSABS", "MONOABS", "EOSABS", "BASOSABS"  Lithium Lvl  Date Value Ref Range Status  05/11/2023 1.0 0.6 - 1.2 mmol/L Final     No results found for: "PHENYTOIN", "PHENOBARB", "VALPROATE", "CBMZ"   .res Assessment: Plan:    48 minutes spent dedicated to the care of this patient on the date of this encounter to include pre-visit review of records, ordering of medication, post visit documentation, and face-to-face time with the patient and her husband  discussing treatment plan to stabilize manic symptoms and sleep disturbance.  Discussed potential benefits, risks, and side effects of Depakote. Pt agrees to trial of Depakote. Will start Depakote ER 500 mg at bedtime for 4-5 nights, then increase to 1000 mg at bedtime for mood stabilization.  Will move Latuda to 120 mg daily with supper to possibly improve insomnia.  Will discontinue Lorazepam and start Klonopin 1 mg 1/2-1 tablet three times daily for anxiety. Discussed potential benefits, risks, and side effects of Klonopin and that she may experience drowsiness. Will discontinue Trileptal due to minimal improvement. Continue Ambien CR 12.5 mg at bedtime since she reports that this has been somewhat more effective than Ambien.  Provider will follow-up with pt by phone tomorrow. Patient advised to contact office with any questions, adverse effects, or acute worsening in signs and symptoms.   Isabel Benson was seen today for manic behavior and insomnia.  Diagnoses and all orders for this visit:  Bipolar affective disorder, current episode hypomanic (HCC) -     divalproex (DEPAKOTE ER) 500 MG 24 hr tablet; Take 1 tablet at bedtime for 4-5 nights, then increase to 2 tablets at bedtime. -     Lurasidone HCl (LATUDA) 120 MG TABS; Take 1 tablet (120 mg total) by mouth daily with supper. -     clonazePAM (KLONOPIN) 1 MG tablet; Take 1/2-1 tablet three times daily  Insomnia due to other mental disorder     Please see After Visit Summary for patient specific instructions.  Future Appointments  Date Time Provider Department Center  09/29/2023  9:00 AM Stevphen Meuse, [facility] None    No orders of the defined types were placed in this encounter.     -------------------------------

## 2023-09-15 ENCOUNTER — Telehealth: Payer: Self-pay | Admitting: Psychiatry

## 2023-09-15 NOTE — Telephone Encounter (Signed)
Called pt to follow-up after medication changes. She reports that she took Klonopin at 3:30 pm yesterday and noticed an immediate benefit in terms of slowing down physically and thoughts not racing as much. She reports feeling that Depakote is helping with her mood and sleep. Reports that she was able to fall asleep without difficulty. She reports that she woke up at 4 am and took a Klonopin and was able to return to sleep for a couple of hours, although sleep was fragmented. She reports that she slept 7 hours last night and that her fitness tracker indicates an improvement in her sleep quality. Her husband reports that her thoughts seem a little slower and she is "not over-explaining." He reports that she did not exhibit increased goal-directed activity this morning like she has most mornings recently. She denies any side effects with medications. She reports that she has not experienced sweaty palms since taking Klonopin yesterday. She and husband report that she seems to fall asleep without difficulty and ask if she could try not taking Ambien CR at bedtime and instead take Ambien IR if/when she experiences early morning awakenings. Discussed that they could try this and that if she is having difficulty falling asleep to take Ambien CR. Discussed using Klonopin to help stabilize manic symptoms. Discussed taking Klonopin again mid-morning, ie. 10 am and then taking 1/2 tab of klonopin late afternoon since they report that they will be celebrating Christmas with family and she would like to avoid being excessively drowsy at that time. Patient advised to contact office with any questions, adverse effects, or acute worsening in signs and symptoms.

## 2023-09-18 NOTE — Telephone Encounter (Addendum)
Husband reported on 09/16/23 that she was exhibiting some increased manic symptoms. Advised that they increase Depakote ER to 1000 mg po at bedtime.

## 2023-09-18 NOTE — Telephone Encounter (Signed)
Called pt to follow-up. She reports that she was able to work yesterday and focus. She went to see [facility] last night and dozed off after taking Klonopin at 4 pm. She was able to socialize with friends. She reports that she did not take Depakote yesterday since she was off schedule. She took Jordan. She reports that she was "able to be present." She reports that she was not anxious. She reports that she went to bed later last night and slept until 8 am this morning. She reports that "my thoughts are where they are supposed to be." She reports that she is no longer experiencing increased goal-directed.   She reports that holidays went well.   Husband reports that mania seems to be controlled. He reports that she is "slightly over-medicated" and somewhat slower physically and mentally. He reports that she is slightly more chatty and "animated" compared to her baseline.   Will continue Depakote Er 500 mg at bedtime. Discussed that she can decrease Klonopin to 1/2 tablet instead of 1 tablet if feeling drowsy or slowed. Pt reports that she will take 1 tablet if she awakens at 4 am and then 1/2 tablet mid day and late afternoon. Patient and husband advised to contact office with any questions, adverse effects, or acute worsening in signs and symptoms.

## 2023-09-19 ENCOUNTER — Other Ambulatory Visit: Payer: Self-pay | Admitting: Psychiatry

## 2023-09-19 DIAGNOSIS — F3176 Bipolar disorder, in full remission, most recent episode depressed: Secondary | ICD-10-CM

## 2023-09-24 ENCOUNTER — Telehealth: Payer: Self-pay | Admitting: Psychiatry

## 2023-09-24 DIAGNOSIS — F310 Bipolar disorder, current episode hypomanic: Secondary | ICD-10-CM

## 2023-09-24 MED ORDER — DIVALPROEX SODIUM ER 250 MG PO TB24: ORAL_TABLET | ORAL | 1 refills | Status: DC

## 2023-09-24 NOTE — Telephone Encounter (Signed)
 Called pt to follow-up.  She reports that her sleep is improving and her sleep quality has been improving per [employer]. She reports feeling, over-medicated, especially in the evening. She has been taking Klonopin   at 4 pm and taking a nap. She has been taking 1 mg at 4:30 am, then 1/2 tablet 10 am, then 1/2 tablet at 4 pm. She is taking Latuda  with supper and it causes her to feel sleepy. She reports that she notices very few manic symptoms. She reports that she has a full range of emotions. She reports that she may be more abrupt and her ability to listen is less. Husband reports that mania is not increasing. He reports that she is now a morning person and has never been a morning person. She has not been taking Ambien  CR  Will stop Klonopin  at 4 pm. Will increase Depakote  ER to 750 mg at bedtime starting Friday, 09/25/23 to target residual manic symptoms. Discussed taking Klonopin  as needed versus on a standing schedule since acute mania has resolved.   Pt advised to contact provider with any questions or worsening symptoms.

## 2023-09-29 ENCOUNTER — Ambulatory Visit (INDEPENDENT_AMBULATORY_CARE_PROVIDER_SITE_OTHER): Payer: BC Managed Care – PPO | Admitting: Psychiatry

## 2023-09-29 DIAGNOSIS — F310 Bipolar disorder, current episode hypomanic: Secondary | ICD-10-CM | POA: Diagnosis not present
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
## 2023-10-01 ENCOUNTER — Telehealth: Payer: Self-pay | Admitting: Psychiatry

## 2023-10-01 DIAGNOSIS — F3176 Bipolar disorder, in full remission, most recent episode depressed: Secondary | ICD-10-CM

## 2023-10-01 DIAGNOSIS — F310 Bipolar disorder, current episode hypomanic: Secondary | ICD-10-CM

## 2023-10-01 MED ORDER — LURASIDONE HCL 80 MG PO TABS: 80.0000 mg | ORAL_TABLET | Freq: Every day | ORAL | 0 refills | Status: AC

## 2023-10-01 MED ORDER — L-METHYLFOLATE 15 MG PO TABS: 15.0000 mg | ORAL_TABLET | Freq: Every day | ORAL | 3 refills | Status: AC

## 2023-10-01 MED ORDER — DIVALPROEX SODIUM ER 500 MG PO TB24: 1000.0000 mg | ORAL_TABLET | Freq: Every day | ORAL | 1 refills | Status: AC

## 2023-10-01 NOTE — Telephone Encounter (Signed)
 She reports, I think I am doing really well, actually. She reports that she has been falling asleep within about 30 minutes after taking Latuda . She reports that she is tolerating Depakote . She reports that she did not take any Klonopin  yesterday. She denies any acute anxiety.She reports that her husband thinks she is having a very low level of mania. She denies excessive shopping. She reports, I feel very stable. She reports that they had a big holiday season with several birthdays.  Will decrease Latuda  to 80 mg daily with supper since she had excessive somnolence after taking 120 mg dose.   Will increase Depakote  ER to 1000 mg at bedtime for mood stabilization.

## 2023-10-14 ENCOUNTER — Ambulatory Visit (INDEPENDENT_AMBULATORY_CARE_PROVIDER_SITE_OTHER): Payer: BC Managed Care – PPO | Admitting: Psychiatry

## 2023-10-14 DIAGNOSIS — F310 Bipolar disorder, current episode hypomanic: Secondary | ICD-10-CM | POA: Diagnosis not present

## 2023-10-14 NOTE — Progress Notes (Unsigned)
[facility] Counselor/Therapist Progress Note  Patient ID: Isabel Benson, MRN: [id number],    Date: 10/14/2023  Time Spent: 60 minutes start time 4:05 PM end time 5:05 PM  Treatment Type: Individual Therapy  Reported Symptoms: anxiety,   Mental Status Exam:  Appearance:   Well Groomed     Behavior:  Appropriate  Motor:  Normal  Speech/Language:   Normal Rate  Affect:  Appropriate  Mood:  anxious  Thought process:  normal  Thought content:    WNL  Sensory/Perceptual disturbances:    WNL  Orientation:  oriented to person, place, time/date, and situation  Attention:  Good  Concentration:  Good  Memory:  WNL  Fund of knowledge:   Good  Insight:    Good  Judgment:   Good  Impulse Control:  Good   Risk Assessment: Danger to Self:  No Self-injurious Behavior: No Danger to Others: No Duty to Warn:no Physical Aggression / Violence:No  Access to Firearms a concern: No  Gang Involvement:No   Subjective: Patient was present for session. She shared she is starting to feel better. She shared that her sleep is more normal. She feels it is really good and she feels her emotions are going down and she is more balanced.  She knows she may have another med change, but she is okay with that. She is noticing her weight gain with the Depakote and that is hard for her. She is training for a half marathon in May.  Hoping to be ready for that event.  Patient developed treatment plan and set goals in session to work on making sure there was a good direction for treatment since things have changed for patient.  As things were discussed and she was able to recognize she has had 5 episodes that have been very significant with her bipolar.  She was able to see she needs to figure out how to work through those episodes and figure out what triggered each of the episodes so that more safety nets can be put in place to manage her bipolar appropriately.  Patient shared some of the history with clinician  and as she was discussing things she was able to recognize it seemed very anxious and overwhelming situations have occurred prior to the different episodes.  Encouraged patient to focus on journaling and continue exercising to work on her self-care.  Patient was able to recognize that she is still going to have a lot of stressful situations and she wants to make sure she is managing them appropriately so another episode is not triggered.  Will start processing through any resolved issues that could contribute to an episode at next session.  Interventions: Solution-Oriented/Positive Psychology insight oriented  Diagnosis:   ICD-10-CM   1. Bipolar affective disorder, current episode hypomanic (HCC)  F31.0       Plan:  Patient is to use CBT and coping skills to decrease mood issues.  Patient is to work on journaling specifically symptoms triggers and skills that are helpful.  Patient is to working on limit setting with her family situation.  Patient is to continue working with her providers on her physical issues.  Patient is to work with a nutritionist. Patient is to continue exercising to release negative emotions appropriately.  Patient is to focus on the things that she can do rather than what she cannot.  Patient is to start back on regular sessions with clinician a new treatment plan will be developed at next session.  Patient is to continue taking medication as directed.   Stevphen Meuse, [facility]

## 2023-10-26 ENCOUNTER — Ambulatory Visit (INDEPENDENT_AMBULATORY_CARE_PROVIDER_SITE_OTHER): Payer: BC Managed Care – PPO | Admitting: Psychiatry

## 2023-10-26 DIAGNOSIS — F310 Bipolar disorder, current episode hypomanic: Secondary | ICD-10-CM | POA: Diagnosis not present
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
## 2023-11-09 ENCOUNTER — Ambulatory Visit: Payer: BC Managed Care – PPO | Admitting: Psychiatry

## 2023-11-09 DIAGNOSIS — F3170 Bipolar disorder, currently in remission, most recent episode unspecified: Secondary | ICD-10-CM | POA: Diagnosis not present
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
## 2023-11-30 ENCOUNTER — Ambulatory Visit: Payer: BC Managed Care – PPO | Admitting: Psychiatry

## 2023-11-30 DIAGNOSIS — F3170 Bipolar disorder, currently in remission, most recent episode unspecified: Secondary | ICD-10-CM | POA: Diagnosis not present
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
## 2023-12-17 ENCOUNTER — Ambulatory Visit: Payer: BC Managed Care – PPO | Admitting: Psychiatry

## 2023-12-17 DIAGNOSIS — F3131 Bipolar disorder, current episode depressed, mild: Secondary | ICD-10-CM | POA: Diagnosis not present

## 2023-12-17 NOTE — Progress Notes (Signed)
 [facility] Counselor/Therapist Progress Note  Patient ID: Isabel Benson, MRN: [id number],    Date: 12/17/2023  Time Spent: 55 minutes start time 9:03 AM end time 9:58 AM  Treatment Type: Individual Therapy  Reported Symptoms: depression, fatigue, low motivation, rumination  Mental Status Exam:  Appearance:   Well Groomed     Behavior:  Appropriate  Motor:  Normal  Speech/Language:   Normal Rate  Affect:  Appropriate  Mood:  labile  Thought process:  normal  Thought content:    WNL  Sensory/Perceptual disturbances:    WNL  Orientation:  oriented to person, place, time/date, and situation  Attention:  Good  Concentration:  Good  Memory:  WNL  Fund of knowledge:   Good  Insight:    Good  Judgment:   Good  Impulse Control:  Good   Risk Assessment: Danger to Self:  No Self-injurious Behavior: No Danger to Others: No Duty to Warn:no Physical Aggression / Violence:No  Access to Firearms a concern: No  Gang Involvement:No   Subjective: Patient was present for session. She shared she has felt more depression since the time change. She shared that she is having a sense of dread with going to work and having fatigue and low motivation which is not her. She went on to share that she is in a reflective period at work and is trying to figure out how to fill a position.  She shared what she has been through and how that is impacting her mood. She went on to share that she is trying to take some time off to help her deal with her mood issues.  Patient was encouraged to recognize that the time off is necessary and she is not been taking as much as she is needed over the past few years so it is appropriate for her to need to work in some time off to get perspective.  Encouraged her to try and take more and do more of the things that she would like to while she has some downtime at work.  Also encouraged her to be realistic on getting a new employee and since it seems she does not have  to get another one right away that it may be important for her to take her time and truly find the right fit.  She was also having pain in her knees and not able to run like she used to. She has gotten her orthotics fixed and that has helped the pain and now she is running again.  Encouraged her to realize not having that release of running probably impacted her negatively as well.  Patient went on to share that one of the biggest issues is still seeing her stepdad who is losing weight and not where he needs to be cognitively.  She is concerned still about her mother and her not accepting anyone's help and knowing that she needs the help because she is seeing her mother decline as well.  Helped patient think through some ways that she could handle that situation appropriately.  The importance of engaging her sister in this process was addressed with patient.  Patient also shared that there are issues with her in-laws who are aging and trying to figure out things for them as difficult as well.  She was encouraged to recognize that she has to visualize letting her husband handle that issue and focus on making sure she does what she needs to do to take care of herself through  this very stressful time.  Interventions: Cognitive Behavioral Therapy, Solution-Oriented/Positive Psychology, and Insight-Oriented  Diagnosis:   ICD-10-CM   1. Bipolar affective disorder, currently depressed, mild (HCC)  F31.31       Plan:  Patient is to use CBT and coping skills to decrease mood issues.  Patient is to take time off to reenergize and improve her mood.  Patient is to talk to her sister about how to handle things with her mother and stepfather.  Patient is to continue to remind herself that the situation she is currently in is temporary and work on her self-care including running regularly.  Patient is to work on journaling specifically symptoms triggers and skills that are helpful.  Patient is to working on limit setting  with her family situation.  Patient is to continue working with her providers on her physical issues.  Patient is to work with a nutritionist. Patient is to continue exercising to release negative emotions appropriately.  Patient is to focus on the things that she can do rather than what she cannot.  Patient is to start back on regular sessions with clinician a new treatment plan will be developed at next session.  Patient is to continue taking medication as directed.   Stevphen Meuse, [facility]

## 2024-01-04 ENCOUNTER — Ambulatory Visit (INDEPENDENT_AMBULATORY_CARE_PROVIDER_SITE_OTHER): Admitting: Psychiatry

## 2024-01-04 DIAGNOSIS — F3131 Bipolar disorder, current episode depressed, mild: Secondary | ICD-10-CM | POA: Diagnosis not present

## 2024-01-04 NOTE — Progress Notes (Unsigned)
 [facility] Counselor/Therapist Progress Note  Patient ID: Isabel Benson, MRN: [id number],    Date: 01/04/2024  Time Spent: 50 minutes start time 1:01 PM end time 1:51 PM Virtual Visit via Video Note Connected with patient by a telemedicine/telehealth application, with their informed consent, and verified patient privacy and that I am speaking with the correct person using two identifiers. I discussed the limitations, risks, security and privacy concerns of performing psychotherapy and the availability of in person appointments. I also discussed with the patient that there may be a patient responsible charge related to this service. The patient expressed understanding and agreed to proceed. I discussed the treatment planning with the patient. The patient was provided an opportunity to ask questions and all were answered. The patient agreed with the plan and demonstrated an understanding of the instructions. The patient was advised to call  our office if  symptoms worsen or feel they are in a crisis state and need immediate contact.   Therapist Location: home Patient Location: home    Treatment Type: Individual Therapy  Reported Symptoms: labile, anxiety, triggered responses, rumination, sadness  Mental Status Exam:  Appearance:   Well Groomed     Behavior:  Appropriate  Motor:  Normal  Speech/Language:   Normal Rate  Affect:  Appropriate  Mood:  constricted  Thought process:  normal  Thought content:    WNL  Sensory/Perceptual disturbances:    WNL  Orientation:  oriented to person, place, time/date, and situation  Attention:  Good  Concentration:  Good  Memory:  WNL  Fund of knowledge:   Good  Insight:    Good  Judgment:   Good  Impulse Control:  Good   Risk Assessment: Danger to Self:  No Self-injurious Behavior: No Danger to Others: No Duty to Warn:no Physical Aggression / Violence:No  Access to Firearms a concern: No  Gang Involvement:No   Subjective: Met  with patient via virtual session. She shared she feels she is still blah but she does not want to make any med changes currently. She shared she is going to talk to her provider about that. She shared that she is having automatic negative thoughts surfacing. Discussed how they are cognitive distortions and the importance of reminding herself of the truth/facts. Encouraged her to think through what might be triggering for her.Did processing set on fight between sister and niece at Thanksgiving SUDS level 9, "I'm disrespected",  felt anxiety in stomach. Patient was able to reduce SUDS level to 2.  She was able to recognize that she can set limits with her family but she also has to recognize that they may have issues but it does not have to deal with her.  Patient shares she is already developed a plan for her family [employer] [place] meal so that there is not another fight at that time.  Patient was encouraged to continue to stay on clinician's schedule on a once a month basis to make sure she is continuing to move in a positive direction again.  Interventions: Cognitive Behavioral Therapy, Eye Movement Desensitization and Reprocessing (EMDR), and Insight-Oriented  Diagnosis:   ICD-10-CM   1. Bipolar affective disorder, currently depressed, mild (HCC)  F31.31       Plan:  Patient is to use CBT and coping skills to decrease mood issues.  Patient is to work on recognizing when she is having cognitive distortions and replace them with the facts/truth.  Patient is to follow plans that she and her husband have  developed to make sure that the Easter family meal goes better.  Patient is to work on journaling specifically symptoms triggers and skills that are helpful.  Patient is to working on limit setting with her family situation.  Patient is to continue working with her providers on her physical issues.  Patient is to work with a nutritionist. Patient is to continue exercising to release negative emotions appropriately.   Patient is to focus on the things that she can do rather than what she cannot.  Patient is to start back on regular sessions with clinician a new treatment plan will be developed at next session.  Patient is to continue taking medication as directed.   Marlise Simpers, [facility]

## 2024-02-22 ENCOUNTER — Ambulatory Visit: Admitting: Psychiatry

## 2024-02-22 DIAGNOSIS — F3170 Bipolar disorder, currently in remission, most recent episode unspecified: Secondary | ICD-10-CM

## 2024-02-22 NOTE — Progress Notes (Addendum)
 [facility] Counselor/Therapist Progress Note  Patient ID: Isabel Benson, MRN: [id number],    Date: 02/22/2024  Time Spent: 48 minutes start time 1:01 PM end time 1:49 PM Virtual Visit via Video Note Connected with patient by a telemedicine/telehealth application, with their informed consent, and verified patient privacy and that I am speaking with the correct person using two identifiers. I discussed the limitations, risks, security and privacy concerns of performing psychotherapy and the availability of in person appointments. I also discussed with the patient that there may be a patient responsible charge related to this service. The patient expressed understanding and agreed to proceed. I discussed the treatment planning with the patient. The patient was provided an opportunity to ask questions and all were answered. The patient agreed with the plan and demonstrated an understanding of the instructions. The patient was advised to call  our office if  symptoms worsen or feel they are in a crisis state and need immediate contact.   Therapist Location: home Patient Location: work    Treatment Type: Individual Therapy  Reported Symptoms: anxiety, triggered responses, rumination  Mental Status Exam:  Appearance:   Well Groomed     Behavior:  Appropriate  Motor:  Normal  Speech/Language:   Normal Rate  Affect:  Appropriate  Mood:  normal  Thought process:  normal  Thought content:    WNL  Sensory/Perceptual disturbances:    WNL  Orientation:  oriented to person, place, time/date, and situation  Attention:  Good  Concentration:  Good  Memory:  WNL  Fund of knowledge:   Good  Insight:    Good  Judgment:   Good  Impulse Control:  Good   Risk Assessment: Danger to Self:  No Self-injurious Behavior: No Danger to Others: No Duty to Warn:no Physical Aggression / Violence:No  Access to Firearms a concern: No  Gang Involvement:No   Subjective: Met with patient via virtual  session. She shared she still has some feelings that are negative but she is feeling better since stopping her Depakote  all together.  She shared that her race  in [place]  was wonderful and she did well. She did get a Systems analyst for some strength training. She is trying to help her mom to get extra care for her dad but she isn't doing much. Patient is realizing that she feels she can do it herself even though she fell a week ago and this was the 4th fall this year. She stated it is a worry on her mind and it is hard. She shared her sister doesn't see what she does with her mother. Her niece failed 3 courses and got a D. Her sister is letting her daughter go back even though her mother and mother in law are paying for it. Patient stated she is in a hard situation because she is having to take care of everything. She is going on a beach trip with her family. She went on to share she knows it will be a whole lot.  Encouraged her to get out more with hier friend and get out so that her sister can see what she is seeing with her parents. Patient also shared that she feels she is managing the dynamics of her sister and her niece better. She shared she is working on her perspective and that is helping her manage the triggers.  Patient is to continue working on perspective and self-care.  Interventions: Cognitive Behavioral Therapy and Insight-Oriented  Diagnosis:   ICD-10-CM  1. Bipolar disorder in partial remission, most recent episode unspecified type (HCC)  F31.70       Plan: Patient is to use CBT and coping skills to decrease mood issues.  Patient is to work on recognizing when she is having cognitive distortions and replace them with the facts/truth.  Patient is to work on journaling specifically symptoms triggers and skills that are helpful.Patient is to continue working with her providers on her physical issues. Patient is to continue exercising to release negative emotions appropriately.   Patient is to focus on the things that she can do rather than what she cannot.  Patient is to start back on regular sessions with clinician a new treatment plan will be developed at next session.  Patient is to continue taking medication as directed.   Marlise Simpers, [facility]

## 2024-10-20 ENCOUNTER — Ambulatory Visit (INDEPENDENT_AMBULATORY_CARE_PROVIDER_SITE_OTHER): Admitting: Psychiatry

## 2024-10-20 ENCOUNTER — Encounter: Payer: Self-pay | Admitting: Psychiatry

## 2024-10-20 DIAGNOSIS — F3176 Bipolar disorder, in full remission, most recent episode depressed: Secondary | ICD-10-CM

## 2024-10-20 NOTE — Progress Notes (Signed)
 "       [facility] Counselor/Therapist Progress Note  Patient ID: Xochil Shanker, MRN: [id number],    Date: 10/20/2024  Time Spent: 61 minutes start time 3:03 PM end time 4:04 PM   Treatment Type: Individual Therapy  Reported Symptoms: anxiety, sadness, grief issues, rumination, fatigue  Mental Status Exam:  Appearance:   Well Groomed     Behavior:  Appropriate  Motor:  Normal  Speech/Language:   Normal Rate  Affect:  Appropriate  Mood:  sad  Thought process:  normal  Thought content:    WNL  Sensory/Perceptual disturbances:    WNL  Orientation:  oriented to person, place, time/date, and situation  Attention:  Good  Concentration:  Good  Memory:  WNL  Fund of knowledge:   Good  Insight:    Good  Judgment:   Good  Impulse Control:  Good   Risk Assessment: Danger to Self:  No Self-injurious Behavior: No Danger to Others: No Duty to Warn:no Physical Aggression / Violence:No  Access to Firearms a concern: No  Gang Involvement:No   Subjective: Patient was more down for an emergency session.  Patient explained that her stepfather who she considers her father had just been placed on hospice care.  She shared that she knew he was declining with his dementia but is gotten to a place where he is not getting out of bed and they are having to give him full care including changing his diapers and rolling him over and trying to prevent bedsores.  Patient stated it is exhausting for her and her mother but her mother is struggling with letting him go into an assisted living or nursing care situation.  Patient explained that she has been charged with doing all the administrative work which she does not mind but is a lot when she is working full-time as well.  Patient stated her workplace is being very supportive which is huge but it is all very taxing and she is not wanting to trigger any depression or mania during this time.  Patient shared some of the things that are going on and her  concerns.  Currently he is on liquid food and has lost over 50 pounds.  Patient was encouraged to recognize that it probably is getting very close to the end of life since he is lost so much weight and is not able to eat much at all his body is just not getting the nutrients that it needs to be able to function.  Patient was encouraged to think through what that would mean for her and how she can prepare herself knowing she will be the one taking care of her mother.  Patient was able to see that his passing would not be the worst thing because she knows he would not be happy with the way he was living and that there is a concern he may be suffering since he really is not verbal and they do not know what is going on with him.  Patient was encouraged to continue working closely with the hospice people to try and find respite for her mother.  Discussed the possibility of him going downhill very quickly since he is already seeing deceased loved ones and again not eating not moving and not drinking much at all.  Patient also shared that her husband has been supportive but is recognizing her sister may or may not be very helpful which makes her feel more pressure as well.  Patient was encouraged to remind  herself she is doing everything she can for her mom and her stepdad and she has to allow her mom space to make decisions but also feel consequences of choices if she chooses not to let him go into a care facility.  Also just encouraged her to take things 1 step at a time and connect with him as she can but to know that he loved her very much and all she is doing is enough.   Interventions: Solution-Oriented/Positive Psychology and Insight-Oriented  Diagnosis:   ICD-10-CM   1. Bipolar disorder, in full remission, most recent episode depressed  F31.76       Plan:  Patient is to use CBT and coping skills to decrease mood issues.  Patient is to work on being supportive of her stepfather and mother as he  transitions into the next step on his journey of life.  Patient is to work on recognizing when she is having cognitive distortions and replace them with the facts/truth.  Patient is to work on journaling specifically symptoms triggers and skills that are helpful.Patient is to continue working with her providers on her physical issues. Patient is to continue exercising to release negative emotions appropriately.  Patient is to focus on the things that she can do rather than what she cannot.  Patient is to start back on regular sessions with clinician a new treatment plan will be developed at next session.  Patient is to continue taking medication as directed.   Silvano Pacini, [facility]                   "
